# Patient Record
Sex: Male | Born: 1935
Health system: Southern US, Community
[De-identification: ages and names within clinical notes are randomized; demographics above are authoritative.]

## PROBLEM LIST (undated history)

## (undated) DIAGNOSIS — E079 Disorder of thyroid, unspecified: Secondary | ICD-10-CM

## (undated) DIAGNOSIS — E119 Type 2 diabetes mellitus without complications: Secondary | ICD-10-CM

## (undated) DIAGNOSIS — F0390 Unspecified dementia without behavioral disturbance: Secondary | ICD-10-CM

## (undated) DIAGNOSIS — I2510 Atherosclerotic heart disease of native coronary artery without angina pectoris: Secondary | ICD-10-CM

## (undated) DIAGNOSIS — E785 Hyperlipidemia, unspecified: Secondary | ICD-10-CM

## (undated) DIAGNOSIS — R4701 Aphasia: Secondary | ICD-10-CM

## (undated) DIAGNOSIS — I10 Essential (primary) hypertension: Secondary | ICD-10-CM

## (undated) DIAGNOSIS — C61 Malignant neoplasm of prostate: Secondary | ICD-10-CM

## (undated) DIAGNOSIS — D649 Anemia, unspecified: Secondary | ICD-10-CM

## (undated) HISTORY — DX: Anemia, unspecified: D64.9

## (undated) HISTORY — DX: Unspecified dementia, unspecified severity, without behavioral disturbance, psychotic disturbance, mood disturbance, and anxiety: F03.90

## (undated) HISTORY — DX: Atherosclerotic heart disease of native coronary artery without angina pectoris: I25.10

## (undated) HISTORY — DX: Essential (primary) hypertension: I10

## (undated) HISTORY — DX: Malignant neoplasm of prostate: C61

## (undated) HISTORY — DX: Hyperlipidemia, unspecified: E78.5

## (undated) HISTORY — DX: Type 2 diabetes mellitus without complications: E11.9

---

## 1958-10-14 HISTORY — PX: FINGER SURGERY: SHX640

## 1998-10-14 HISTORY — PX: CATARACT EXTRACTION: SUR2

## 2005-08-27 ENCOUNTER — Ambulatory Visit: Payer: Self-pay | Admitting: Internal Medicine

## 2005-08-27 ENCOUNTER — Inpatient Hospital Stay (HOSPITAL_COMMUNITY): Admission: EM | Admit: 2005-08-27 | Discharge: 2005-09-02 | Payer: Self-pay | Admitting: Emergency Medicine

## 2005-09-02 ENCOUNTER — Encounter: Payer: Self-pay | Admitting: Internal Medicine

## 2005-09-16 ENCOUNTER — Ambulatory Visit: Payer: Self-pay | Admitting: Cardiology

## 2008-01-11 ENCOUNTER — Ambulatory Visit: Payer: Self-pay | Admitting: Internal Medicine

## 2008-01-19 ENCOUNTER — Ambulatory Visit: Payer: Self-pay | Admitting: Internal Medicine

## 2009-08-22 ENCOUNTER — Emergency Department (HOSPITAL_COMMUNITY): Admission: EM | Admit: 2009-08-22 | Discharge: 2009-08-22 | Payer: Self-pay | Admitting: Emergency Medicine

## 2010-11-19 DIAGNOSIS — I10 Essential (primary) hypertension: Secondary | ICD-10-CM | POA: Insufficient documentation

## 2010-11-20 ENCOUNTER — Encounter: Payer: Self-pay | Admitting: Cardiovascular Disease

## 2010-11-20 ENCOUNTER — Ambulatory Visit (INDEPENDENT_AMBULATORY_CARE_PROVIDER_SITE_OTHER): Payer: Medicare Other | Admitting: Cardiovascular Disease

## 2010-11-20 DIAGNOSIS — I10 Essential (primary) hypertension: Secondary | ICD-10-CM

## 2010-11-20 DIAGNOSIS — I2510 Atherosclerotic heart disease of native coronary artery without angina pectoris: Secondary | ICD-10-CM
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
## 2010-12-17 ENCOUNTER — Encounter: Payer: Self-pay | Admitting: Internal Medicine

## 2010-12-17 ENCOUNTER — Ambulatory Visit (INDEPENDENT_AMBULATORY_CARE_PROVIDER_SITE_OTHER): Payer: Medicare Other | Admitting: Internal Medicine

## 2010-12-17 DIAGNOSIS — R197 Diarrhea, unspecified: Secondary | ICD-10-CM

## 2010-12-17 DIAGNOSIS — R159 Full incontinence of feces: Secondary | ICD-10-CM

## 2010-12-17 DIAGNOSIS — F0390 Unspecified dementia without behavioral disturbance: Secondary | ICD-10-CM | POA: Insufficient documentation
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
## 2011-01-01 NOTE — Miscellaneous (Signed)
Summary: [facility]   Imported By: Bubba Hales 12/26/2010 10:56:49  _____________________________________________________________________  External Attachment:    Type:   Image     Comment:   External Document

## 2011-01-16 LAB — URINALYSIS, ROUTINE W REFLEX MICROSCOPIC
Bilirubin Urine: NEGATIVE
Glucose, UA: NEGATIVE mg/dL
Ketones, ur: NEGATIVE mg/dL
Leukocytes, UA: NEGATIVE
Nitrite: NEGATIVE
Protein, ur: NEGATIVE mg/dL
Specific Gravity, Urine: 1.006 (ref 1.005–1.030)
Urobilinogen, UA: 0.2 mg/dL (ref 0.0–1.0)
pH: 7 (ref 5.0–8.0)

## 2011-01-16 LAB — COMPREHENSIVE METABOLIC PANEL
ALT: 22 U/L (ref 0–53)
AST: 23 U/L (ref 0–37)
Albumin: 3.3 g/dL — ABNORMAL LOW (ref 3.5–5.2)
Alkaline Phosphatase: 64 U/L (ref 39–117)
BUN: 18 mg/dL (ref 6–23)
CO2: 27 mEq/L (ref 19–32)
Calcium: 8.3 mg/dL — ABNORMAL LOW (ref 8.4–10.5)
Chloride: 99 mEq/L (ref 96–112)
Creatinine, Ser: 1.22 mg/dL (ref 0.4–1.5)
GFR calc Af Amer: >60 mL/min (ref >60)
GFR calc non Af Amer: 56 mL/min — ABNORMAL LOW (ref >60)
Glucose, Bld: 166 mg/dL — ABNORMAL HIGH (ref 70–99)
Potassium: 3.5 mEq/L (ref 3.5–5.1)
Sodium: 131 mEq/L — ABNORMAL LOW (ref 135–145)
Total Bilirubin: 0.4 mg/dL (ref 0.3–1.2)
Total Protein: 6.1 g/dL (ref 6.0–8.3)

## 2011-01-16 LAB — CBC
HCT: 29.5 % — ABNORMAL LOW (ref 39.0–52.0)
Hemoglobin: 10.1 g/dL — ABNORMAL LOW (ref 13.0–17.0)
MCHC: 34.3 g/dL (ref 30.0–36.0)
MCV: 93.9 fL (ref 78.0–100.0)
Platelets: 150 10*3/uL (ref 150–400)
RBC: 3.14 MIL/uL — ABNORMAL LOW (ref 4.22–5.81)
RDW: 14.7 % (ref 11.5–15.5)
WBC: 4.9 10*3/uL (ref 4.0–10.5)

## 2011-01-16 LAB — DIFFERENTIAL
Basophils Absolute: 0 10*3/uL (ref 0.0–0.1)
Basophils Relative: 1 % (ref 0–1)
Eosinophils Absolute: 0.2 10*3/uL (ref 0.0–0.7)
Eosinophils Relative: 4 % (ref 0–5)
Lymphocytes Relative: 25 % (ref 12–46)
Lymphs Abs: 1.2 10*3/uL (ref 0.7–4.0)
Monocytes Absolute: 0.7 10*3/uL (ref 0.1–1.0)
Monocytes Relative: 13 % — ABNORMAL HIGH (ref 3–12)
Neutro Abs: 2.8 10*3/uL (ref 1.7–7.7)
Neutrophils Relative %: 57 % (ref 43–77)

## 2011-01-16 LAB — URINE MICROSCOPIC-ADD ON: Urine-Other: NONE SEEN

## 2011-02-04 ENCOUNTER — Encounter: Payer: Self-pay | Admitting: Internal Medicine

## 2011-02-04 ENCOUNTER — Ambulatory Visit (INDEPENDENT_AMBULATORY_CARE_PROVIDER_SITE_OTHER): Payer: Medicare Other | Admitting: Internal Medicine

## 2011-02-04 DIAGNOSIS — R197 Diarrhea, unspecified: Secondary | ICD-10-CM

## 2011-02-04 DIAGNOSIS — R159 Full incontinence of feces: Secondary | ICD-10-CM

## 2011-02-04 NOTE — Assessment & Plan Note (Signed)
Improved but still a problem. Also having urinary incontinence thinks it is a function of his dementia and other issues. Daughter agrees this is about as good as it can get at this point. His overall situation is improved by stopping sugar-free candy.

## 2011-02-04 NOTE — Assessment & Plan Note (Signed)
Improved by stopping sugar-free candy. Daughter is satisfied with current status. Stool studies were not done. She believes she is improved as much as possible at this point so we'll hold here. Follow up as needed.

## 2011-02-04 NOTE — Progress Notes (Signed)
77 year old white man seen previously for diarrhea and fecal incontinence. He has been moved into a more supervised setting at the nursing home given his dementia. There's been good reduction if not elimination and use of sugar-free candy in his diarrhea and fecal incontinence are improved though he still has incontinence of stool and urine. His daughter is overall satisfied with things. He is wearing depends. His medications been modified to minimize diarrhea as well. His stool studies for the last day since he therefore not completed but the daughter feels like they're not necessary at this time so we will not do that.  Medications past medical history allergies all reviewed and reflected in the database.

## 2011-02-25 ENCOUNTER — Encounter: Payer: Medicare Other | Admitting: Internal Medicine

## 2011-02-26 NOTE — Assessment & Plan Note (Signed)
[place] OFFICE NOTE   NAME:GREENLauris, Umbach                           MRN:          [id number]  DATE:01/11/2008                            DOB:          08-05-1920    REFERRING PHYSICIAN:  Tammy R. Modena Morrow, M.D.   CHIEF COMPLAINT:  Anemia.   ASSESSMENT:  An 77 year old white man with a chronic anemia which is  somewhat worse and has not responded to iron.  This may very well be an  anemia of chronic disease.  On rectal exam today he had a Hemoccult  positive stool.  He has had some stools that seem to be a little bit  looser over time, so there may be a change in bowel habits but the  situation is complicated by dementia and he does not live here.  This  has been observed by his daughter when she has him up here in [place]  from his home in [place].  He has had about a 10 pound weight loss over  the last couple of years though his appetite is good.  That certainly  could be from diabetes that is not well controlled, as his blood sugars  do go over 200 at times.  He does not have any early satiety or appetite  change.   RECOMMENDATIONS AND PLAN:  A colonoscopy will be undertaken because of  the heme positive stool and the anemia.  Further plans pending that.  If  that is unrevealing, I probably would not pursue any further evaluation  at this time as there is an overall relatively set of stable symptoms  and signs it seems.   HISTORY:  An 77 year old white man with problems as above.  He came to  Dr. Greta Doom' attention after he had a syncopal episode and hyperglycemia  and perhaps DKA in 2006.  At that time he had hemoglobin in the 11-13  range, somewhat variable on hospital records, with a normal MCV.  Now he  has a hemoglobin in the low 11 to 10.9 range (today) with an MCV of 90.  Platelets slightly low at 133, white count 4.9.  He cannot provide much  reliable history due to the dementia and significant  memory disturbance.  His daughter believes his stools have become somewhat loose over time  without incontinence in the past several months or so.  He denies any  other problems other than those things mentioned above.  The anemia has  not really responded to iron supplementation as noted.  I do not know if  he has had B12 or folate checked recently.  They were normal in November  of 2006.  So was his TSH.   MEDICATIONS:  1. Actos 15 mg daily.  2. Lisinopril 40 mg daily.  3. Coreg 12.5 mg b.i.d.  4. Glucotrol XL 5 mg b.i.d.  5. Aspirin 81 mg daily.  6. HCTZ 25 mg daily.  7. Exelon 3 mg b.i.d.  8. Namenda 10 mg b.i.d.  9. Ferrous sulfate 325 mg daily.  10.Vytorin 10/10 daily.   There are no known  drug allergies.   PAST MEDICAL HISTORY:  1. Dementia, Alzheimer's type.  2. Hypertension.  3. Diabetes mellitus type 2.  4. Dyslipidemia.  5. Prostate cancer, 2002.  6. Previous history of angioplasty.  7. Cataract surgery in 2000.  8. History of right basal ganglia infarction and occipital lobe      infarcts as well.   FAMILY HISTORY:  Diabetes in a brother.   SOCIAL HISTORY:  1. He is widowed.  2. He lives alone.  3. He does have somebody come in and check on him.  4. He still drives a short way.  5. He is retired from a Midwife.  6. No alcohol, tobacco or drugs.  7. His daughter looks in on him as best she can.  He comes up here      with her at times.  He is currently here because the person that      had been checking on him was unable to do so over the next 2 weeks.   REVIEW OF SYSTEMS:  Positive for confusion and memory disturbance,  urinary difficulty at times.  All other systems are negative, as best we  can tell.   PHYSICAL EXAMINATION:  Height 5 feet 6 inches, weight 144 pounds, blood  pressure 128/60, pulse 60 and regular.  This is a well-developed,  elderly white man looking a little younger than stated age.  EYES:  Anicteric.  MOUTH:  Free of  lesions.  NECK:  Supple, no thyromegaly or mass.  CHEST:  Clear.  HEART:  S1 S2.  No rubs, murmurs or gallops are heard.  ABDOMEN:  Soft and nontender, without organomegaly or mass.  RECTAL:  Shows brown Hemoccult positive stool.  Formed stool in the  vault, no mass.  EXTREMITIES:  No edema.  NEURO:  He is awake.  He appears alert but he is not oriented at all.  Cranial nerves II through XII grossly intact.  PSYCH:  Appropriate mood and affect.   I appreciate the opportunity to care for this patient.   Note, I have looked at all hospital records as well as recent labs and  office notes from Dr. Modena Morrow.     Gatha Mayer, MD,FACG  Electronically Signed    CEG/MedQ  DD: 01/11/2008  DT: 01/11/2008  Job #: [id number]   cc:   Tammy R. Modena Morrow, M.D.

## 2011-02-27 ENCOUNTER — Other Ambulatory Visit: Payer: Self-pay | Admitting: Internal Medicine

## 2011-02-27 ENCOUNTER — Encounter (HOSPITAL_BASED_OUTPATIENT_CLINIC_OR_DEPARTMENT_OTHER): Payer: Medicare Other | Admitting: Internal Medicine

## 2011-02-27 DIAGNOSIS — D539 Nutritional anemia, unspecified: Secondary | ICD-10-CM

## 2011-02-27 DIAGNOSIS — D649 Anemia, unspecified: Secondary | ICD-10-CM

## 2011-02-27 LAB — CBC & DIFF AND RETIC
BASO%: 0.5 % (ref 0.0–2.0)
Basophils Absolute: 0 10*3/uL (ref 0.0–0.1)
EOS%: 3 % (ref 0.0–7.0)
Eosinophils Absolute: 0.2 10*3/uL (ref 0.0–0.5)
HCT: 29.8 % — ABNORMAL LOW (ref 38.4–49.9)
HGB: 9.9 g/dL — ABNORMAL LOW (ref 13.0–17.1)
Immature Retic Fract: 5.8 % (ref 0.00–13.40)
LYMPH%: 19.1 % (ref 14.0–49.0)
MCH: 30.6 pg (ref 27.2–33.4)
MCHC: 33.2 g/dL (ref 32.0–36.0)
MCV: 92 fL (ref 79.3–98.0)
MONO#: 0.7 10*3/uL (ref 0.1–0.9)
MONO%: 11.1 % (ref 0.0–14.0)
NEUT#: 3.9 10*3/uL (ref 1.5–6.5)
NEUT%: 66.3 % (ref 39.0–75.0)
Platelets: 168 10*3/uL (ref 140–400)
RBC: 3.24 10*6/uL — ABNORMAL LOW (ref 4.20–5.82)
RDW: 15.2 % — ABNORMAL HIGH (ref 11.0–14.6)
Retic %: 0.66 % (ref 0.50–1.60)
Retic Ct Abs: 21.38 10*3/uL — ABNORMAL LOW (ref 24.10–77.50)
WBC: 5.9 10*3/uL (ref 4.0–10.3)
lymph#: 1.1 10*3/uL (ref 0.9–3.3)
nRBC: 0 % (ref 0–0)

## 2011-03-01 LAB — COMPREHENSIVE METABOLIC PANEL
AST: 19 U/L (ref 0–37)
Albumin: 3.6 g/dL (ref 3.5–5.2)
Alkaline Phosphatase: 59 U/L (ref 39–117)
BUN: 37 mg/dL — ABNORMAL HIGH (ref 6–23)
Calcium: 8.2 mg/dL — ABNORMAL LOW (ref 8.4–10.5)
Chloride: 101 mEq/L (ref 96–112)
Creatinine, Ser: 1.27 mg/dL (ref 0.40–1.50)
Glucose, Bld: 179 mg/dL — ABNORMAL HIGH (ref 70–99)
Potassium: 4 mEq/L (ref 3.5–5.3)

## 2011-03-01 LAB — IRON AND TIBC
%SAT: 23 % (ref 20–55)
Iron: 63 ug/dL (ref 42–165)
TIBC: 274 ug/dL (ref 215–435)
UIBC: 211 ug/dL

## 2011-03-01 LAB — PROTEIN ELECTROPHORESIS, SERUM
Albumin ELP: 59.5 % (ref 55.8–66.1)
Alpha-1-Globulin: 4.6 % (ref 2.9–4.9)
Alpha-2-Globulin: 11.2 % (ref 7.1–11.8)
Beta 2: 4 % (ref 3.2–6.5)
Gamma Globulin: 14.8 % (ref 11.1–18.8)
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
## 2011-03-01 NOTE — Cardiovascular Report (Signed)
[name], [name]                  ACCOUNT NO.:  [id number]   MEDICAL RECORD NO.:  [id number]          PATIENT TYPE:  INP   LOCATION:  6527                         FACILITY:  [facility]   PHYSICIAN:  [name], M.D. LHCDATE OF BIRTH:  12-05-1919   DATE OF PROCEDURE:  08/28/2005  DATE OF DISCHARGE:                              CARDIAC CATHETERIZATION   PROCEDURE:  Cutting balloon angioplasty of the ostium of the first diagonal  branch.   INDICATIONS:  [name] is an 77 year old gentleman without prior history of  cardiovascular disease. On the afternoon of  August 24, 2005, he had an  episode of syncope. Troponin was mildly elevated. The patient has mild  dementia but did not recall any chest discomfort nor did he complain of such  to family. Per family's recommendations, he was initially hospitalized at  [facility] in [facility]. Echocardiogram reportedly demonstrated  anterior hypokinesis with an ejection fraction of 45%. He has not had  recurrent symptoms. The patient brought him to [place], which his care  was proceeded with here. He was admitted to hospital yesterday and underwent  diagnostic angiography by Dr. Wilhemina [name] today. This demonstrated a 95% stenosis  of the ostium of the large first diagonal branch. Left ventricular EF was  65% without regional wall motion abnormality. I was asked for percutaneous  intervention on this lesion.   It was certainly not clear that the lesion was the cause of his syncope.  However, it had a hazy appearance to it. The elevated troponin did suggest a  possible acute coronary syndrome and the anterior hypokinesis demonstrated  on the echocardiogram two days ago at [facility] may have suggested some  stunting which has subsequently recovered. The territory fit with that of  the diagonal. I was thus concerned that this was the culprit lesion for an  acute coronary syndrome and decided to proceed with percutaneous  revascularizations to minimize the chance of recurrence.   PROCEDURAL TECHNIQUE:  Informed consent was obtained from the patient's  family. The patient also consented. There was a preexisting 6-French sheath  in the right common femoral artery. Anticoagulation was initiated with  bivalirudin and 600 milligrams of Plavix. ACT was maintained at greater than  225 seconds. A 6-French CLS 3.5 guide was advanced over wire and engaged in  the ostium of the left main. A Prowater wire was advanced in to the distal  portion of the first diagonal without difficulty. The lesion was initially  dilated using a 2.0 x 9 mm maverick for two inflations at 6 atmospheres.  This demonstrated no plaque shift into the LAD. I thus proceeded with  cutting balloon angioplasty using a 2.25 x 10 mm cutter for two inflations,  each to 10 atmospheres. With the second inflation, it was clear that there  was some leakage of contrast from the balloon. Surprisingly, pressure was  maintained within the groin. With the leakage, however, it was clear that  there was a small perforation of the balloon. Balloon was therefore quickly  deflated and removed. Repeat angiography demonstrated approximately 10%  residual stenosis with node dissection, no perforation, and TIMI III flow to  the distal vasculature. There was no plaque shift into the LAD.   The arteriotomy was then closed using a Starclose device. Complete  hemostasis was obtained. The patient was then transferred to holding room in  stable condition having tolerated the procedure well.   COMPLICATIONS:  None.   IMPRESSION/RECOMMENDATIONS:  Successful cutting balloon angioplasty of the  first diagonal branch of the left anterior descending artery. Since I am  concerned that the precipitating event was an acute coronary syndrome, I  will give Plavix for 30 days in addition to his aspirin. With his dementia,  I do not think we should continue it beyond that. After that,  we will  continue with Plavix alone.      [name], M.D. [facility]  Electronically Signed     WED/MEDQ  D:  08/28/2005  T:  08/28/2005  Job:  [id number]   cc:   [name]  Fax: [phone]   [name], M.D.  [facility]

## 2011-03-01 NOTE — Discharge Summary (Signed)
NAME:  [name], Cory                  ACCOUNT NO.:  [id number]   MEDICAL RECORD NO.:  [id number]          PATIENT TYPE:  INP   LOCATION:  62                         FACILITY:  [facility]   PHYSICIAN:  Mobolaji B. Bakare, M.D.DATE OF BIRTH:  03/17/1920   DATE OF ADMISSION:  08/27/2005  DATE OF DISCHARGE:  09/02/2005                                 DISCHARGE SUMMARY   ADDENDUM:  This is an addendum to an earlier dictation done by Dr.  Sherryl Manges dictated August 30, 2005.   PRIMARY CARE PHYSICIAN:  Tammy R. Modena Morrow, M.D.   CARDIOLOGIST:  Glori Bickers, M.D.   NEUROLOGIST:  Dr. Henrene Dodge. Doy Mince.   Mr. Cozad was discharged on September 02, 2005.   DISCHARGE MEDICATIONS:  1.  Aspirin 81 mg once a day.  2.  Plavix 75 mg daily.  3.  Coreg 12.5 mg b.i.d.  4.  Glucotrol XL 5 mg b.i.d.  5.  Hydrochlorothiazide 12.5 mg once a day.  6.  Lisinopril 40 mg once a day.  7.  Actos 15 mg once a day.  8.  Zocor 40 mg once a day.   FOLLOW UP:  1.  With Dr. Haroldine Laws in two weeks.  2.  Dr. Laurann Montana in two weeks.  3.  Dr. Florina Ou in one to two weeks.   ADDITIONAL OPERATION/PROCEDURE:  Transesophageal echocardiogram:  The left  ventricular systolic function was normal.  Left ventricular ejection  fraction was estimated  between 55 to 65%.  There was no left ventricular  regional wall motion abnormalities.  There was mild atheroma of the  ascending aorta, trivial aortic valvular regurgitation, mild mitral valvular  regurgitation. There was no left atrial appendage thrombus identified.  There was a left atrial septal aneurysm.  There was no evidence of shunting  across the entire atrial septum by color-flow Doppler.   HOSPITAL COURSE:  Problem #1.  The patient was stable while waiting for the  transesophageal echocardiogram.  There was no identifiable signs of tumors  and the patient did not have to be on anticoagulation.  The transesophageal  echocardiogram did show left atrial  aneurysm but there was no evidence of  shunting across the entire atrial septum.  He was continued on aspirin and  Plavix.   Problem #2.  Blood pressure control was further optimized by increasing the  patient's Coreg and at the time of discharge, he was discharged home on 12.5  mg p.o. b.i.d.  Blood pressure was 105/67.   Problem #3. Diabetes control was further optimized with addition of Actos to  the Glucotrol XL.  This can be further assessed and optimized by Dr. Modena Morrow  as an outpatient.  The patient and family have been instructed to monitor  CBG.   Problem #4.  Probable underlying dementia.  Mr. Witmer was observed in the  hospital and he was noted to be confused and wandering around a lot during  the hospital stay.  Daughter has also noted symptoms to be suggestive of  progressive dementia.  The patient was not started on any medication during  this hospitalization.  Would defer further evaluation and possible treatment  to Dr. Florina Ou.      Mobolaji B. Maia Petties, M.D.  Electronically Signed     MBB/MEDQ  D:  09/11/2005  T:  09/11/2005  Job:  [id number]   cc:   Tammy R. Modena Morrow, M.D.  Fax: [phone]   Pramod P. Leonie Man, MD  Fax: [phone]   Glori Bickers, M.D. [place]  [place] [address]

## 2011-03-01 NOTE — Discharge Summary (Signed)
[name], [name]                  ACCOUNT NO.:  [id number]   MEDICAL RECORD NO.:  [id number]          PATIENT TYPE:  INP   LOCATION:  6527                         FACILITY:  [facility]   PHYSICIAN:  Sherryl Manges, M.D.  DATE OF BIRTH:  July 07, 1920   DATE OF ADMISSION:  08/27/2005  DATE OF DISCHARGE:                                 DISCHARGE SUMMARY   INTERIM DISCHARGE SUMMARY:   DATE OF DISCHARGE:  To be determined.   PRIMARY MEDICAL DOCTORS:  1.  Tammy R. Modena Morrow, M.D., [facility], [place], [place].  2.  Dr. Gwenyth Bouillon, [place], [place], [facility].   DIAGNOSES:  1.  Syncopal episode, query etiology. Likely secondary to embolic      cerebrovascular accident.  2.  Multiple embolic cerebrovascular accidents, effecting right basal      ganglia and bilateral occipital lobes.  3.  Coronary artery disease with single vessel disease, status post PTCA,      August 29, 2005.  4.  Type 2 diabetes mellitus.  5.  Dyslipidemia.  6.  Hypertension.   DISCHARGE MEDICATIONS:  1.  Coreg 6.25 mg p.o. b.i.d.  2.  Aspirin 81 mg p.o. every day.  3.  Plavix 75 mg p.o. every day for 30 days only.  4.  Glipizide 5 mg p.o. b.i.d.  5.  Insulin sliding scale with NovoLog according to the following regimen:      CBG 60-100, no insulin.  CBG 101-150, 2units.  CBG 151-200, 3 units.      CBG 201-250, 6 units.  CBG 251-300, 9 units.  CBG 301-350, 12 units.      CBG over 350, 15 units.  6.  Lisinopril 40 mg p.o. daily.  7.  Zocor 40 mg p.o. q.h.s.  This medication list may, of course, be updated in addendum by discharging  M.D. at the time of actual discharge.   PROCEDURES:  1.  Chest x-ray, dated August 27, 2005.  This showed no definitive active      disease within the lungs.  There are two vague densities overlying the      left mid lung field, of doubtful clinical significance.  2.  Brain MRI/MRA, dated August 29, 2005.  This showed right basal ganglia  infarction involving the lentiform and caudate nuclei with suggestion of      central areas of hemorrhage.  Additional acute/subacute infarction of      the occipital lobes bilaterally, right greater than left with some      extension to the ventricular surface on the right.  Also, additional      areas of punctate and confluent T2 hyper-intensities in the subcortical      periventricular areas bilaterally, most likely sequelae of chronic      microvascular ischemia.  MRA shows extensive irregular narrowing      throughout the anterior and posterior separation compatible with      intracranial atherosclerosis.  3.  Chest CT angiogram, dated August 29, 2005.  This showed no evidence of      pulmonary embolism.  There was  borderline left ventricular wall      thickening.  4.  Head CT scan, dated August 30, 2005.  This showed infarcts as noted on      recent MRI and appear non-hemorrhagic.  These include right basal      ganglia infarct and bilateral occipital infarcts.  5.  Carotid/vertebral artery Duplex scan, dated August 28, 2005.  This      showed on the right mild intimal wall changes CCA.  Minimal      heterogeneous plaque at the bifurcation/origin of ICA and ECA.  Left      minimal intimal wall changes, proximal to mid CCA, increasing to mild at      the bifurcation, mild focal heterogeneous plaque posterior wall proximal      CCA.  No evidence of significant ICA stenosis.  Vertebral artery flow is      antegrade.  6.  Cardiac catheterization, dated August 28, 2005, by Dr. Glori Bickers.  This showed left main coronary artery which is large with      luminal irregularities, left anterior descending coronary artery with a      95% stenosis in proximal portion of the first diagonal, second diagonal      is moderate caliber with 65% to 70% stenosis.  Assessment:  Single      vessel coronary artery disease in diagonal. Normal left ventricular      systolic function.  7.   Cutting balloon angioplasty of the ostium of the first diagonal branch      of the LAD by Dr. Sabino Snipes, August 28, 2005.   CONSULTATIONS:  1.  Glori Bickers, M.D. [facility], cardiologist.  2.  Ethelle Lyon, M.D. [facility], cardiologist.  [place] Doy Mince, M.D., neurologist.  4.  Pramod P. Leonie Man, M.D., neurologist.   ADMISSION HISTORY:  As in H&P notes of August 27, 2005.  However, in  brief, this is an 77 year old male, with known history of hypertension and  recently diagnosed diabetes mellitus, presenting to the emergency department  at [facility],  via referral by PMD, Dr. Florina Ou, for further  investigation, evaluation, and management.  Apparently, the patient had the  syncopal episode on the afternoon of August 15, 2005, following lunch at [employer].  He was subsequently admitted at the [facility] in  [facility] where he was found to be markedly hyperglycemic with a blood  glucose of 452 mg/dL, with no antecedent history of diabetes mellitus.  He  was also found to have a mildly elevated troponin I.  Head CT scan was  negative.  Following discharge from hospital on August 25, 2005, he was  subsequently evaluated by cardiologist, Dr. Dorian Pod, who found him to have an  abnormal electrocardiogram.  On visiting his PMD, Dr. Modena Morrow, in the a.m. of  August 27, 2005, he was referred to [facility] as stated above.   CLINICAL COURSE:  1.  SYNCOPAL EPISODE.  This is deemed likely secondary to cerebrovascular      disease, due to multiple embolic infarcts.  Following admission, the      patient was examined, and no focal neurologic deficit was found on      initial evaluation.  Review of medical records, which the patient      brought with him, indicated that head CT scan, dated August 24, 2005      at [facility], was negative for acute intracranial  pathology.     It was felt appropriate to carry out followup brain MRI/MRA, to  arrange      a carotid Duplex scan, and check lipid profile.  Details of brain MRI      are as stated in procedure list above.  However, in brief it showed a      right basal ganglia CVA with probable hemorrhage as well as bilateral      ischemic, possibly embolic, occipital lobe infarcts .  Followup head CT      scan, on August 30, 2005, showed no evidence of hemorrhage.  The      patient is, therefore, currently on antiplatelet medications.  Review of      the patient's lipid profile showed the following findings:  Total      cholesterol 209, triglycerides 93, HDL 50, LDL 140.  He has, therefore,      been started on high dose Statin treatment accordingly.  Thyroid profile      was normal with a TSH of 2.222.  RPR was nonreactive.  Homocystine level      was normal at 14.2.  Neurologic consultation was requested and kindly      provided by Drs. Reynolds and [employer].  Conclusion is that the patient's      syncopal episode is likely secondary to embolic phenomena, resulting in      multiple embolic CVAs.  Apart from risk factor modification, it is felt      that the patient will benefit from a transesophageal echocardiogram,      looking specifically for a cardioembolic source, as this would determine      whether the patient should be anticoagulated or not.  It is also felt      the patient may require a period of inpatient rehabilitation.   1.  CORONARY ARTERY DISEASE.  As mentioned in admitting history, the patient      was found to have a mildly elevated troponin I at the [facility].  He underwent a 2D echocardiogram by cardiologist, Dr. Dorian Pod,      who found the patient to have a left ventricular ejection fraction of      45%, normal valves, mild septal hypertrophy, dilated right atrium and      right ventricle, and also hypokinesis of left ventricular anterior wall.      With these abnormalities in mind and against the background of risk      factors such as age,  gender, recently diagnosed diabetes mellitus,      hypertension, dyslipidemia, there was a high index of suspicion for      coronary artery disease.  Cardiology consultation was called, which was      kindly provided by Dr. Glori Bickers, who recommended cardiac      catheterization.  This was carried out on August 28, 2005 and      essentially showed single vessel coronary artery disease involving the      first diagonal branch of the LAD with 95% narrowing.  This was      subsequently addressed with PCI via cutting PTCA on August 28, 2005,      carried out by Dr. Albertine Patricia.  The patient tolerated the procedure well.      It was recommended he remain on Plavix for 30 days following this      procedure.  He will require cardiology followup upon discharge.  He has  currently been placed on Coreg.   1.  NEWLY DIAGNOSED TYPE 2 DIABETES MELLITUS.  As mentioned in the above     admission history, the patient was found to have hyperglycemia of 452      mg/dL on August 20, 2005, at presentation at [facility].  He      was managed at [facility] with a combination of carbohydrate      modified diet, glipizide, and sliding scale insulin, with satisfactory      control and euglycemia.  His daughter and son in law, have been given      diabetic teaching and taught how to monitor the patient's blood glucose      levels and administer insulin via sliding scale.  It is expected that on      discharge, the patient will follow up with his PMD, Dr. Charleston Poot,      who will monitor his glycemia, adjust medications as indicated.   1.  HYPERTENSION.  The patient is a known hypertensive.  His blood pressure      has been adequately controlled with a combination of Coreg and ACE      inhibitor treatment.  Of course, given his recent CVAs, the plan is not      to be overly aggressive with blood pressure control at the present time,      provided systolic BP remains less than  180-mmHg.   1.  DYSLIPIDEMIA.  The patient is currently on high dose Statin for this.   DISPOSITION:  This is to be elucidated in addendum, at the time of actual  discharge by the discharging M.D.  It is pertinent to note that because of  incidental finding of right ventricular/right atrial dilatation on initial  echocardiogram, August 26, 2005, by Dr. Dorian Pod, it was felt essential to  rule out pulmonary embolism.  The patient underwent a chest CT angiogram on  August 29, 2005, which was negative for pulmonary embolism.  As  mentioned above, the plan is to carry out TEE to rule out possible  cardioembolic source of embolization.  Should this be present, the patient  will likely require anticoagulation.  Per neurology recommendations, the  patient is likely to require period of inpatient rehabilitation.      Sherryl Manges, M.D.  Electronically Signed     CO/MEDQ  D:  08/30/2005  T:  08/30/2005  Job:  [id number]   cc:   Tammy R. Modena Morrow, M.D.  Fax: [phone]   Gwenyth Bouillon, Dr.  [place], [place]  Fax #: [phone]   Glori Bickers, M.D. [place] [address]  [place]  [place] [address]   Ethelle Lyon, M.D. [facility]  [address]  [place] [address]   Michael L. Doy Mince, M.D.  Fax: [phone]   Pramod P. Leonie Man, MD  Fax: [phone]

## 2011-03-01 NOTE — Consult Note (Signed)
NAMEDUJUAN, AGRELLA                  ACCOUNT NO.:  [id number]   MEDICAL RECORD NO.:  [id number]          PATIENT TYPE:  INP   LOCATION:  [id number]                         FACILITY:  [facility]   PHYSICIAN:  Glori Bickers, M.D. LHCDATE OF BIRTH:  06-07-1920   DATE OF CONSULTATION:  08/27/2005  DATE OF DISCHARGE:                                   CONSULTATION   PRIMARY CARE PHYSICIAN:  Dr. Gwenyth Bouillon, [place], [facility]; Dr.  Jae Dire. Modena Morrow [place], [place].   REQUESTING PHYSICIAN:  Sherryl Manges, M.D.   REASON FOR CONSULTATION:  Syncopal episode and abnormal echocardiogram.   Mr. Pink is an 77 year old male with a history of hypertension, dementia,  and recently diagnosed diabetes, who we are asked to consult on regarding a  recent syncopal episode and abnormal echocardiogram. He denies any history  of known coronary artery disease. He has never had a previous stress test or  cardiac catheterization. As he does have significant problem with his short-  term memory, most of the history is garnered from a combination of the  chart, Mr. Conner, and his daughter. Reportedly, he had abrupt syncopal  episode while eating lunch on August 24, 2005. According to witnesses he  was sitting at a table when he became stiff and just fell over backwards  hitting his head on the carpet. There was no antecedent chest pain,  palpitations. There was no seizure activity or bladder or bowel  incontinence. He was taken to the [facility]. It was reportedly  mildly elevated 0.50. He was also found to be markedly hyperglycemic with a  blood sugar of greater than 450. He was triaged and discharged home the next  day on November 12th. [place] An  echocardiogram was obtained which showed an EF of 45% with anterior  hypokinesis. There is also a dilated right atrium and right ventricle with  mild tricuspid regurgitation. Cardiac catheterization was  suggested, but the  family elected to bring him to [facility] for further evaluation. This  morning he went to Dr. Florina Ou in primary care who admitted for further  workup. He is currently asymptomatic.   REVIEW OF SYSTEMS:  He denies any recent fevers or chills. He has not had a  cough. He has not had any bright red blood per rectum or melena. He denies  any change in his functional capacity. At baseline, he lives alone and does  all of his activities of daily living, though he does not exercise on a  regular basis. His daughter does admit that he has significant problems with  short-term memory loss. The rest of the review of systems is negative except  for HPI and problem list.   PAST MEDICAL HISTORY:  1.  Hypertension, poorly controlled.  2.  Diabetes, new diagnosis.  3.  Syncopal episode as described in HPI.      1.  Echocardiogram showed an EF of 45% with anterior hypokinesis. There          is also dilated RA and RV.  4.  Dementia with short-term memory loss.   CURRENT MEDICATIONS:  1.  Lisinopril 20 mg daily.  2.  Januvia 25 mg daily.  3.  Aspirin 325 mg daily.   ALLERGIES:  No known drug allergies.   SOCIAL HISTORY:  He lives alone in [place], [place]. He is retired. He  is also a widow. He denies any history of tobacco use. He does not drink any  alcohol. He has two daughters, one who lives in [place] and the other  lives in [place].   FAMILY HISTORY:  He has had two brothers  that died of heart disease.  Another brother had diabetes. His parents are deceased and his mother had  hypertension.   PHYSICAL EXAMINATION:  GENERAL: He is lying flat in bed, in no acute  distress.  VITAL SIGNS: He is afebrile with blood pressure of 175/70, heart rate 72,  saturating 97% on room air.  HEENT: Sclerae anicteric. EOMI. There is no xanthelasma. Mucous membranes  are moist.  NECK: Supple. There is no JVD. Carotids are 2+ bilaterally without any  bruits. There is  no lymphadenopathy or thyromegaly.  CARDIAC: Regular rate and rhythm with no murmurs, rubs, or gallops.  LUNGS: Clear to auscultation.  ABDOMEN: Soft, nontender, nondistended. There is no hepatosplenomegaly.  There are good bowel sounds. There are no bruits or masses.  EXTREMITIES: Warm with no clubbing, cyanosis, or edema. Femoral pulses are  2+ bilaterally without any bruits. Distal pulses are 1+ bilaterally.  NEUROLOGIC: He is awake, alert, and oriented times three with much  difficulty. He is unable to tell me who the president is. He has asked the  same questions over multiple times. Cranial nerves II-XII are intact. He  moves al four extremities without difficulty.   LABORATORY DATA:  White count 5.3, hematocrit 37.2%, platelet count 168,000.  Sodium 138, potassium 4.0, BUN 18, creatinine 1.1, glucose 270, troponin  0.09, CK 887, MB 1.9, albumin 3.3.   EKG from November 12th shows a normal sinus rhythm at 63, with inferolateral  ST-T wave changes consistent with repolarization versus ischemia. There is  no old tracing. He does have minimal ST elevation in aVL.   ASSESSMENT:  1.  Syncopal episode.  2.  Abnormal echocardiogram as described above.  3.  Diabetes, poorly controlled, new diagnosis.  4.  Hypertension, poorly controlled.  5.  Dementia.   DISCUSSION/PLAN:  Given the results of his echocardiogram, ECG, and  increased troponin, Mr. Ansari almost certainly  has a significant underlying  coronary artery disease. I am concerned that his syncopal episode may be  related to a ventricular arrhythmia in the setting of an acute coronary  syndrome. Fortunately, he is now asymptomatic. I have discussed the risks  and benefits of cardiac catheterization with both Mr. Mincey and his  daughter, and they have agreed to proceed. I also told them that given his  dementia if he had three-vessel coronary artery disease he would not be a CABG candidate. Of note, given the RA and RV  dilatation on the  echocardiogram this also raises the possibility of pulmonary embolus, but  without significant tachypnea or tachycardia I think this is a lot less  likely.   RECOMMENDATIONS:  1.  Cardiac catheterization in the morning.  2.  Begin Coreg 6.25 mg b.i.d. for blood pressure and heart rate control.  3.  Continue to watch on telemetry.  4.  Start Statin and continue his aspirin.  5.  Hold the ACE inhibitor  overnight and resume post catheterization.   Will continue to follow with you in the hospital and we appreciate the  consult.      Glori Bickers, M.D. [facility]  Electronically Signed     DB/MEDQ  D:  08/27/2005  T:  08/28/2005  Job:  [id number]   cc:   Tammy R. Modena Morrow, M.D.  Fax: [phone]   Gwenyth Bouillon, M.D.  [place], [place]

## 2011-03-01 NOTE — Cardiovascular Report (Signed)
NAMERAYYAN, MELLGREN                  ACCOUNT NO.:  [id number]   MEDICAL RECORD NO.:  [id number]          PATIENT TYPE:  INP   LOCATION:  3741                         FACILITY:  [facility]   PHYSICIAN:  Scarlett Presto, M.D.   DATE OF BIRTH:  Dec 19, 1919   DATE OF PROCEDURE:  08/28/2005  DATE OF DISCHARGE:                              CARDIAC CATHETERIZATION   PRIMARY CARE PHYSICIAN:  [facility].   CARDIOLOGIST:  Glori Bickers, M.D.   PROCEDURE:  1.  Left heart catheterization.  2.  Selective coronary angiography.  3.  Left ventriculography.   OPERATOR:  Scarlett Presto, M.D.   INDICATIONS FOR PROCEDURE:  Mr. Sarafin is an 77 year old man with significant  dementia, who had an episode of syncope while eating lunch.  He was  evaluated and found to have a mildly abnormal echocardiogram with some  evidence of LV dysfunction and wall motion abnormality.  He was referred for  a heart catheterization.   DESCRIPTION OF PROCEDURE:  After obtaining an informed consent from his  daughter, who is his power of attorney, the patient came to the cardiac  catheterization laboratory in the fasting state.  There he was prepped and  draped in the usual sterile manner and local anesthetic was obtained in the  right groin using 1% lidocaine without epinephrine.  The right femoral  artery was cannulated using the modified Seldinger technique with a 6-French  1 cm sheath and a left heart catheterization was performed using a 6-French  Judkins left #4, a 6-French Judkins right #4 and a 6-French pigtail  catheter.  A pigtail catheter was used for left ventriculography in the 30-  degree RAO view.  At the conclusion of the procedure the catheters were  removed.  The femoral artery sheath was sewn in place for a percutaneous  revascularization.   PRESSURES:  Aortic pressure:  136/57, mean of 86 mmHg.  Left ventricular pressure:  137/3, with an end diastolic pressure of 9 mmHg.   CORONARY ANGIOGRAPHY:  1.  LEFT MAIN CORONARY ARTERY:  The left main coronary artery is a large      vessel with luminal irregularities.  2.  LEFT ANTERIOR DESCENDING CORONARY ARTERY:  The left anterior descending      coronary artery is a large trans-apical vessel which has a large first      diagonal with a 95% stenosis in its proximal portion.  It is a large      artery that bifurcates.  The second diagonal is a moderate-caliber      vessel which has a 65%-70% stenosis.  3.  CIRCUMFLEX CORONARY ARTERY:  The circumflex coronary artery is a small      vessel with luminal irregularities.  4.  RIGHT CORONARY ARTERY:  The right coronary artery is a large dominant      vessel with multiple branching vessels with luminal irregularities.   LEFT VENTRICULOGRAM:  The left ventriculogram reveals a normal left  ventricular systolic function.  The estimated ejection fraction is 65%.  No  wall motion abnormalities.  No mitral regurgitation.   ASSESSMENT:  1.  Single vessel coronary artery disease in the diagonal.  2.  Normal left ventricular systolic function.  3.  Syncope.   PLAN:  To move forward with a percutaneous revascularization.      Scarlett Presto, M.D.  Electronically Signed     JH/MEDQ  D:  08/28/2005  T:  08/29/2005  Job:  [id number]

## 2011-03-01 NOTE — Consult Note (Signed)
[name], [name]                  ACCOUNT NO.:  [id number]   MEDICAL RECORD NO.:  [id number]          PATIENT TYPE:  INP   LOCATION:  6527                         FACILITY:  [facility]   PHYSICIAN:  Michael L. Reynolds, M.D.DATE OF BIRTH:  08-13-1920   DATE OF CONSULTATION:  08/29/2005  DATE OF DISCHARGE:                                   CONSULTATION   REFERRED BY:  Sherryl Manges, M.D.   REASON FOR CONSULTATION:  Stroke.   HISTORY OF PRESENT ILLNESS:  This is the initial inpatient consultation and  evaluation of this 77 year old man with a past medical history which until  recently was remarkable only for hypertension.  On the afternoon of August 24, 2005, he was at [employer] and had a witnessed episode in which he  slumped in his chair after a meal.  EMS was called and the patient was  taken to the emergency department at [facility] in [facility].  His  glucose was found to be 452, troponin I mildly elevated.  He reportedly had  a CT of the head at that time which was unremarkable, but is not available  for review.  He was discharged from the hospital on August 26, 2005, and  then saw a cardiologist in the area on August 27, 2005.  A 2-D  echocardiogram at that time showed hypokinesis of the left ventricular wall  with an ejection fraction of 45%.  It was suggested to him at that time that  he be admitted to the hospital in [place] but instead his daughter  brought him to [facility], as she lives in this area, for  a completion of his workup.  He was admitted to the hospital service and had  a consultation with [facility].  His workup has included carotid  duplex study which demonstrated no significant ICA stenosis and antegrade  vertebral artery flow, as well as a cardiac catheterization performed on  August 28, 2005, which demonstrated a 95% diagonal branch lesion on which  a cutting balloon angioplasty was successfully  performed.  The patient was  placed on aspirin and Plavix following the procedure.  He underwent an MRI  of the brain with an MRA and intracranial circulation today, which  demonstrated multiple strokes, which is further outlined below, and a  question of hemorrhagic component to at least one of these strokes was  raised.  A neurological consultation is requested for further medication  management.  Furthermore, the patient has been noted to be confused and  wandering around a lot during the hospital stay.  According to the chart,  his daughters have noticed progressive dementia and confusion.   PAST MEDICAL HISTORY:  As above.  He had not had a diagnosis of dementia  before arriving in the hospital.  There is no known history of strokes or  syncopal spells.   FAMILY HISTORY/SOCIAL HISTORY/REVIEW OF SYSTEMS:  Per the admission H&P by  Dr. Blenda Nicely on August 27, 2005, which is reviewed.  Also see the accompanying  medical student note.   HOME  MEDICATIONS:  Prior to admission he was taking Lisinopril and aspirin.   HOSPITAL MEDICATIONS:  1.  He is presently taking Coreg.  2.  Zocor.  3.  Lisinopril.  4.  Glucotrol.  5.  Lovenox.  6.  Aspirin.  7.  Protonix.  8.  Sliding scale insulin.   PHYSICAL EXAMINATION:  VITAL SIGNS:  Temperature 98.3 degrees, blood  pressure 155/83, pulse 63, respirations 18.  GENERAL:  This is a healthy-appearing male, in no distress.  HEENT:  Head normocephalic and atraumatic.  Oropharynx benign.  NECK:  Supple without carotid or supraclavicular bruits.  HEART:  Regular rate and rhythm without murmurs.  NEUROLOGIC:  Mental status:  He is awake and alert.  He is variably  oriented.  Per the medical student he was not oriented to place or to time,  but for me he said he was in a hospital in an unknown city in [place].  He correctly identified the month.  He has obvious short-term  memory deficits.  On examination unable to perform concentration tasks  or  recall tasks and repeating questions.  He is, however, able to name objects  and repeat phrases.  Mood is euthymic and affect appropriate.  Cranial  nerves x5 exam is benign.  Pupils equal and briskly reactive.  Extraocular  movements full without nystagmus.  On visual examination, I think he has a  little bit of neglect in the left lower quadrant.  His face and palate move  normally and symmetrically.  Facial sensation is intact to pinprick.  Hearing is intact to conversational speech.  Shoulder shrug and strength are  normal.  Motor testing:  Normal bulk and tone.  Normal strength in all  tested extremity muscles.  Sensation intact to light touch, joint position  and double simultaneous stimulation in all extremities.  Coordination:  Rapid movements are performed well.  Finger-to-nose and heel-to-shin are  performed well.  Gait:  He arises from the chair easily.  His stance is  normal.  He is able to ambulate without difficulty.  Reflexes are 2+ and  symmetric.  Toes are downgoing bilaterally.   LABORATORY DATA:  CBC:  White count 6.1, hemoglobin 12.8, platelets 184,000.  BMET is remarkable only for an elevated glucose of 244.  TSH is normal at  2.22.  Homocysteine level is slightly elevated.  RPR nonreactive.  Hemoglobin A1c is markedly elevated at 12.2.  Total cholesterol 209, with  HDL of 50 and LDL elevated at 140.   An MRI of the brain performed today is personally reviewed.  The study is  remarkable for small acute, subacute infarctions in both occipital lobes,  more temporal occipital on the left and closer to the occipital pole on the  right.  There is also an acute infarction in the right basal ganglia and  corona radiata.  The question of hemorrhagic component has been raised.  Also present are a moderate degree of atrophy and white matter disease.  The  MRA shows diffuse atherosclerotic changes, particular in the basilar artery.   IMPRESSION:  1.  Multiple subacute  embolic strokes.  This may have occurred following an      arrhythmia of cardiac emboli or possibly following his catheterization.      The question of associated hemorrhage has been raised.  This is subtle      on the MRI.  2.  Significant intracranial atherosclerotic disease.  3.  Strongly suspect underlying dementia.   PLAN:  Will  check a CT of the head to get a better look at the blood and  then can make recommendations about his antiplatelet therapy.  He already  has medications for his hyperlipidemia.  He needs better control of his risk  factors.  As far as his memory problems, it appears that he is not going to  be going home alone any time soon, which I think is an outstanding idea.  If  he does return to [facility], would strongly suggest a closely supervised  living situation.  He needs to be followed up for his memory loss by a  neurologist either here or there.  We will be happy to do that if  he stays in this area.  He is probably going to need to be placed on  medications for his memory following an outpatient evaluation.   Thank you for this consultation.      Michael L. Doy Mince, M.D.  Electronically Signed     MLR/MEDQ  D:  08/29/2005  T:  08/30/2005  Job:  [id number]   cc:   Sherryl Manges, M.D.

## 2011-03-14 ENCOUNTER — Other Ambulatory Visit: Payer: Self-pay | Admitting: Internal Medicine

## 2011-03-14 ENCOUNTER — Encounter (HOSPITAL_BASED_OUTPATIENT_CLINIC_OR_DEPARTMENT_OTHER): Payer: Medicare Other | Admitting: Internal Medicine

## 2011-03-14 DIAGNOSIS — D539 Nutritional anemia, unspecified: Secondary | ICD-10-CM

## 2011-03-14 LAB — CBC WITH DIFFERENTIAL/PLATELET
BASO%: 0.4 % (ref 0.0–2.0)
Basophils Absolute: 0 10*3/uL (ref 0.0–0.1)
EOS%: 2.6 % (ref 0.0–7.0)
HCT: 32.2 % — ABNORMAL LOW (ref 38.4–49.9)
HGB: 10.6 g/dL — ABNORMAL LOW (ref 13.0–17.1)
LYMPH%: 19.2 % (ref 14.0–49.0)
MCH: 30.3 pg (ref 27.2–33.4)
MCHC: 32.9 g/dL (ref 32.0–36.0)
MONO#: 0.8 10*3/uL (ref 0.1–0.9)
NEUT%: 66.4 % (ref 39.0–75.0)
Platelets: 166 10*3/uL (ref 140–400)
lymph#: 1.3 10*3/uL (ref 0.9–3.3)

## 2011-05-09 ENCOUNTER — Telehealth: Payer: Self-pay | Admitting: Cardiovascular Disease

## 2011-05-09 NOTE — Telephone Encounter (Signed)
LOV,12 faxed to [facility] @ [phone]  05/09/11/km

## 2011-10-22 DIAGNOSIS — Z23 Encounter for immunization: Secondary | ICD-10-CM | POA: Diagnosis not present

## 2011-10-22 DIAGNOSIS — E039 Hypothyroidism, unspecified: Secondary | ICD-10-CM | POA: Diagnosis not present

## 2012-01-14 DIAGNOSIS — E119 Type 2 diabetes mellitus without complications: Secondary | ICD-10-CM | POA: Diagnosis not present

## 2012-01-14 DIAGNOSIS — E039 Hypothyroidism, unspecified: Secondary | ICD-10-CM | POA: Diagnosis not present

## 2012-01-14 DIAGNOSIS — I10 Essential (primary) hypertension: Secondary | ICD-10-CM | POA: Diagnosis not present

## 2012-01-14 DIAGNOSIS — I2510 Atherosclerotic heart disease of native coronary artery without angina pectoris: Secondary | ICD-10-CM | POA: Diagnosis not present

## 2012-01-29 DIAGNOSIS — H35379 Puckering of macula, unspecified eye: Secondary | ICD-10-CM | POA: Diagnosis not present

## 2012-01-29 DIAGNOSIS — H35729 Serous detachment of retinal pigment epithelium, unspecified eye: Secondary | ICD-10-CM | POA: Diagnosis not present

## 2012-01-29 DIAGNOSIS — H4720 Unspecified optic atrophy: Secondary | ICD-10-CM | POA: Diagnosis not present

## 2012-01-29 DIAGNOSIS — H353190 Nonexudative age-related macular degeneration, unspecified eye, stage unspecified: Secondary | ICD-10-CM | POA: Diagnosis not present

## 2012-01-29 DIAGNOSIS — Z961 Presence of intraocular lens: Secondary | ICD-10-CM | POA: Diagnosis not present

## 2012-02-27 DIAGNOSIS — I10 Essential (primary) hypertension: Secondary | ICD-10-CM | POA: Diagnosis not present

## 2012-02-27 DIAGNOSIS — E039 Hypothyroidism, unspecified: Secondary | ICD-10-CM | POA: Diagnosis not present

## 2012-02-27 DIAGNOSIS — E119 Type 2 diabetes mellitus without complications: Secondary | ICD-10-CM | POA: Diagnosis not present

## 2012-02-27 DIAGNOSIS — I2510 Atherosclerotic heart disease of native coronary artery without angina pectoris: Secondary | ICD-10-CM | POA: Diagnosis not present

## 2012-05-14 DIAGNOSIS — IMO0002 Reserved for concepts with insufficient information to code with codable children: Secondary | ICD-10-CM | POA: Diagnosis not present

## 2012-06-16 DIAGNOSIS — H10029 Other mucopurulent conjunctivitis, unspecified eye: Secondary | ICD-10-CM | POA: Diagnosis not present

## 2012-07-02 DIAGNOSIS — E785 Hyperlipidemia, unspecified: Secondary | ICD-10-CM | POA: Diagnosis not present

## 2012-07-02 DIAGNOSIS — I2510 Atherosclerotic heart disease of native coronary artery without angina pectoris: Secondary | ICD-10-CM | POA: Diagnosis not present

## 2012-07-02 DIAGNOSIS — E119 Type 2 diabetes mellitus without complications: Secondary | ICD-10-CM | POA: Diagnosis not present

## 2012-07-02 DIAGNOSIS — I10 Essential (primary) hypertension: Secondary | ICD-10-CM | POA: Diagnosis not present

## 2012-07-15 DIAGNOSIS — M109 Gout, unspecified: Secondary | ICD-10-CM | POA: Diagnosis not present

## 2012-07-15 DIAGNOSIS — E119 Type 2 diabetes mellitus without complications: Secondary | ICD-10-CM | POA: Diagnosis not present

## 2012-07-15 DIAGNOSIS — E782 Mixed hyperlipidemia: Secondary | ICD-10-CM | POA: Diagnosis not present

## 2012-07-15 DIAGNOSIS — I2510 Atherosclerotic heart disease of native coronary artery without angina pectoris: Secondary | ICD-10-CM | POA: Diagnosis not present

## 2012-07-15 DIAGNOSIS — F0390 Unspecified dementia without behavioral disturbance: Secondary | ICD-10-CM | POA: Diagnosis not present

## 2012-07-15 DIAGNOSIS — I10 Essential (primary) hypertension: Secondary | ICD-10-CM | POA: Diagnosis not present

## 2012-07-15 DIAGNOSIS — Z8546 Personal history of malignant neoplasm of prostate: Secondary | ICD-10-CM | POA: Diagnosis not present

## 2012-07-15 DIAGNOSIS — R32 Unspecified urinary incontinence: Secondary | ICD-10-CM | POA: Diagnosis not present

## 2012-07-16 DIAGNOSIS — Z23 Encounter for immunization: Secondary | ICD-10-CM | POA: Diagnosis not present

## 2012-09-05 DIAGNOSIS — J069 Acute upper respiratory infection, unspecified: Secondary | ICD-10-CM | POA: Diagnosis not present

## 2012-09-07 ENCOUNTER — Ambulatory Visit
Admission: RE | Admit: 2012-09-07 | Discharge: 2012-09-07 | Disposition: A | Discharge status: Home / Self Care | Payer: Medicare Other | Source: Ambulatory Visit | Attending: Family Medicine | Admitting: Family Medicine

## 2012-09-07 ENCOUNTER — Other Ambulatory Visit: Payer: Self-pay | Admitting: Family Medicine

## 2012-09-07 DIAGNOSIS — R600 Localized edema: Secondary | ICD-10-CM

## 2012-09-07 DIAGNOSIS — R609 Edema, unspecified: Secondary | ICD-10-CM | POA: Diagnosis not present

## 2012-10-20 DIAGNOSIS — E119 Type 2 diabetes mellitus without complications: Secondary | ICD-10-CM | POA: Diagnosis not present

## 2012-10-20 DIAGNOSIS — E782 Mixed hyperlipidemia: Secondary | ICD-10-CM | POA: Diagnosis not present

## 2012-10-20 DIAGNOSIS — I10 Essential (primary) hypertension: Secondary | ICD-10-CM | POA: Diagnosis not present

## 2012-10-20 DIAGNOSIS — R142 Eructation: Secondary | ICD-10-CM | POA: Diagnosis not present

## 2012-10-20 DIAGNOSIS — F0390 Unspecified dementia without behavioral disturbance: Secondary | ICD-10-CM | POA: Diagnosis not present

## 2012-10-20 DIAGNOSIS — I2510 Atherosclerotic heart disease of native coronary artery without angina pectoris: Secondary | ICD-10-CM | POA: Diagnosis not present

## 2012-10-20 DIAGNOSIS — R141 Gas pain: Secondary | ICD-10-CM | POA: Diagnosis not present

## 2012-10-20 DIAGNOSIS — E039 Hypothyroidism, unspecified: Secondary | ICD-10-CM | POA: Diagnosis not present

## 2013-01-09 DIAGNOSIS — R21 Rash and other nonspecific skin eruption: Secondary | ICD-10-CM | POA: Diagnosis not present

## 2013-01-18 ENCOUNTER — Observation Stay (HOSPITAL_COMMUNITY)
Admission: EM | Admit: 2013-01-18 | Discharge: 2013-01-19 | Disposition: A | Discharge status: Home / Self Care | Payer: Medicare Other | Attending: Emergency Medicine | Admitting: Emergency Medicine

## 2013-01-18 DIAGNOSIS — F0390 Unspecified dementia without behavioral disturbance: Secondary | ICD-10-CM | POA: Diagnosis not present

## 2013-01-18 DIAGNOSIS — I10 Essential (primary) hypertension: Secondary | ICD-10-CM

## 2013-01-18 DIAGNOSIS — I2510 Atherosclerotic heart disease of native coronary artery without angina pectoris: Secondary | ICD-10-CM

## 2013-01-18 DIAGNOSIS — E785 Hyperlipidemia, unspecified: Secondary | ICD-10-CM | POA: Diagnosis not present

## 2013-01-18 DIAGNOSIS — N183 Chronic kidney disease, stage 3 unspecified: Secondary | ICD-10-CM | POA: Diagnosis not present

## 2013-01-18 DIAGNOSIS — I129 Hypertensive chronic kidney disease with stage 1 through stage 4 chronic kidney disease, or unspecified chronic kidney disease: Secondary | ICD-10-CM | POA: Diagnosis not present

## 2013-01-18 DIAGNOSIS — R079 Chest pain, unspecified: Principal | ICD-10-CM | POA: Insufficient documentation

## 2013-01-18 DIAGNOSIS — E119 Type 2 diabetes mellitus without complications: Secondary | ICD-10-CM | POA: Insufficient documentation

## 2013-01-18 DIAGNOSIS — M79609 Pain in unspecified limb: Secondary | ICD-10-CM | POA: Insufficient documentation

## 2013-01-18 DIAGNOSIS — J984 Other disorders of lung: Secondary | ICD-10-CM | POA: Diagnosis not present

## 2013-01-18 DIAGNOSIS — R4182 Altered mental status, unspecified: Secondary | ICD-10-CM | POA: Diagnosis not present

## 2013-01-18 NOTE — ED Notes (Signed)
EMS states was called out to SNF to see pt for chest pain. EMS states pt has given multiple sites of pain, left arm and chest then states he is not having any pain. EMS also states pt was combative in route, pt is calm at this time.

## 2013-01-19 ENCOUNTER — Emergency Department (HOSPITAL_COMMUNITY): Payer: Medicare Other

## 2013-01-19 DIAGNOSIS — I10 Essential (primary) hypertension: Secondary | ICD-10-CM

## 2013-01-19 DIAGNOSIS — J984 Other disorders of lung: Secondary | ICD-10-CM | POA: Diagnosis not present

## 2013-01-19 DIAGNOSIS — R079 Chest pain, unspecified: Secondary | ICD-10-CM | POA: Diagnosis not present

## 2013-01-19 DIAGNOSIS — I2510 Atherosclerotic heart disease of native coronary artery without angina pectoris: Secondary | ICD-10-CM

## 2013-01-19 DIAGNOSIS — F0390 Unspecified dementia without behavioral disturbance: Secondary | ICD-10-CM | POA: Diagnosis not present

## 2013-01-19 LAB — CBC
HCT: 27 % — ABNORMAL LOW (ref 39.0–52.0)
Hemoglobin: 9.1 g/dL — ABNORMAL LOW (ref 13.0–17.0)
MCV: 87.9 fL (ref 78.0–100.0)
RDW: 15.2 % (ref 11.5–15.5)
WBC: 7.4 10*3/uL (ref 4.0–10.5)

## 2013-01-19 LAB — COMPREHENSIVE METABOLIC PANEL
Albumin: 3 g/dL — ABNORMAL LOW (ref 3.5–5.2)
Alkaline Phosphatase: 61 U/L (ref 39–117)
BUN: 46 mg/dL — ABNORMAL HIGH (ref 6–23)
Chloride: 101 mEq/L (ref 96–112)
Creatinine, Ser: 1.48 mg/dL — ABNORMAL HIGH (ref 0.50–1.35)
GFR calc Af Amer: 46 mL/min — ABNORMAL LOW (ref >90)
GFR calc non Af Amer: 40 mL/min — ABNORMAL LOW (ref >90)
Glucose, Bld: 158 mg/dL — ABNORMAL HIGH (ref 70–99)
Potassium: 3.8 mEq/L (ref 3.5–5.1)
Total Bilirubin: 0.2 mg/dL — ABNORMAL LOW (ref 0.3–1.2)

## 2013-01-19 LAB — POCT I-STAT TROPONIN I: Troponin i, poc: 0.01 ng/mL (ref 0.00–0.08)

## 2013-01-19 MED ORDER — ASPIRIN 81 MG PO CHEW
324.0000 mg | CHEWABLE_TABLET | Freq: Once | ORAL | Status: AC
  Administered 2013-01-19: 324 mg via ORAL
  Filled 2013-01-19: qty 4

## 2013-01-19 NOTE — ED Notes (Signed)
Report received, assumed care.  

## 2013-01-19 NOTE — Consult Note (Signed)
Patient's PCP: Florina Ou, MD Referring physician: Dr. Mitzi Hansen  Chief Complaint: Chest pain  History of Present Illness: [name] is a 79 y.o. Caucasian male with history of advanced dementia, hypertension, coronary disease, hyperlipidemia, diabetes, chronic kidney disease stage III, and anemia who presents with the above complaints.  Due to patient's advanced dementia and he is not a reliable historian.  It was reported that apparently at [facility] patient complained of chest pain.  It is not known where the patient had chest pain and for how long he had it.  He was transferred to the ER for further evaluation.  The hospitalist service was consulted for further care and management.  Currently patient denies any chest pain or difficulty breathing.  Review of Systems: Limited due to patient's dementia.  Past Medical History  Diagnosis Date  . Hypertension   . CAD (coronary artery disease)   . Hyperlipidemia   . DM (diabetes mellitus)   . Dementia   . Anemia   . Gout   . Prostate cancer    Past Surgical History  Procedure Laterality Date  . Finger surgery  1960  . Cataract extraction  2000   Family History  Problem Relation Age of Onset  . Heart disease Brother   . Diabetes Brother   . Hypertension Mother   . Colon cancer Neg Hx    History   Social History  . Marital Status: Widowed    Spouse Name: N/A    Number of Children: 2  . Years of Education: N/A   Occupational History  . retired    Social History Main Topics  . Smoking status: Never Smoker   . Smokeless tobacco: Never Used  . Alcohol Use: No  . Drug Use: No  . Sexually Active: Not on file   Other Topics Concern  . Not on file   Social History Narrative  . No narrative on file   Allergies: Review of patient's allergies indicates no known allergies.  Home Meds: Prior to Admission medications   Medication Sig Start Date End Date Taking? Authorizing Provider  amLODipine (NORVASC) 2.5 MG tablet  Take 2.5 mg by mouth daily.     Yes Historical Provider, MD  aspirin 81 MG chewable tablet Chew 81 mg by mouth daily.   Yes Historical Provider, MD  calcium-vitamin D (OSCAL WITH D) 500-200 MG-UNIT per tablet Take 1 tablet by mouth daily.   Yes Historical Provider, MD  carvedilol (COREG) 12.5 MG tablet Take 12.5 mg by mouth 2 (two) times daily with a meal.     Yes Historical Provider, MD  ezetimibe-simvastatin (VYTORIN) 10-10 MG per tablet Take 1 tablet by mouth at bedtime.     Yes Historical Provider, MD  glipiZIDE (GLUCOTROL) 10 MG tablet Take 10 mg by mouth 3 (three) times daily with meals.   Yes Historical Provider, MD  haloperidol (HALDOL) 0.5 MG tablet Take 0.5 mg by mouth at bedtime.    Yes Historical Provider, MD  hydrochlorothiazide 25 MG tablet Take 25 mg by mouth daily.     Yes Historical Provider, MD  insulin aspart protamine-insulin aspart (NOVOLOG 70/30) (70-30) 100 UNIT/ML injection Inject 7 Units into the skin daily with lunch.   Yes Historical Provider, MD  levothyroxine (SYNTHROID, LEVOTHROID) 25 MCG tablet Take 25 mcg by mouth daily before breakfast.   Yes Historical Provider, MD  lisinopril (PRINIVIL,ZESTRIL) 40 MG tablet Take 40 mg by mouth daily.     Yes Historical Provider, MD  sitaGLIPtin (JANUVIA)  100 MG tablet Take 100 mg by mouth daily.   Yes Historical Provider, MD  Specialty Vitamins Products (ICAPS LUTEIN-ZEAXANTHIN PO) Take 1 tablet by mouth 2 (two) times daily.     Yes Historical Provider, MD  vitamin B-12 (CYANOCOBALAMIN) 1000 MCG tablet Take 1,000 mcg by mouth daily.   Yes Historical Provider, MD  acetaminophen (MAPAP) 325 MG tablet Take 650 mg by mouth every 6 (six) hours as needed for pain.     Historical Provider, MD    Physical Exam: Blood pressure 158/71, pulse 66, temperature 98.5 F (36.9 C), temperature source Oral, resp. rate 13, SpO2 95.00%. General: Sleeping but easily arousable, No acute distress. HEENT: Eyes closed, Moist mucous membranes Neck:  Supple CV: S1 and S2 Lungs: Clear to ascultation bilaterally Abdomen: Soft, Nontender, Nondistended, +bowel sounds. Ext: Good pulses. Trace edema. No clubbing or cyanosis noted. Neuro: Cranial Nerves grossly intact.  Moving all of his extremities.  Lab results:  Recent Labs  01/19/13 0035  NA 136  K 3.8  CL 101  CO2 26  GLUCOSE 158*  BUN 46*  CREATININE 1.48*  CALCIUM 8.6    Recent Labs  01/19/13 0035  AST 23  ALT 13  ALKPHOS 61  BILITOT 0.2*  PROT 6.2  ALBUMIN 3.0*   No results found for this basename: LIPASE, AMYLASE,  in the last 72 hours  Recent Labs  01/19/13 0035  WBC 7.4  HGB 9.1*  HCT 27.0*  MCV 87.9  PLT 141*   No results found for this basename: CKTOTAL, CKMB, CKMBINDEX, TROPONINI,  in the last 72 hours No components found with this basename: POCBNP,  No results found for this basename: DDIMER,  in the last 72 hours No results found for this basename: HGBA1C,  in the last 72 hours No results found for this basename: CHOL, HDL, LDLCALC, TRIG, CHOLHDL, LDLDIRECT,  in the last 72 hours No results found for this basename: TSH, T4TOTAL, FREET3, T3FREE, THYROIDAB,  in the last 72 hours No results found for this basename: VITAMINB12, FOLATE, FERRITIN, TIBC, IRON, RETICCTPCT,  in the last 72 hours Imaging results:  Dg Chest 2 View  01/19/2013  *RADIOLOGY REPORT*  Clinical Data: Left arm and chest pain.  CHEST - 2 VIEW  Comparison: Chest radiograph performed 08/27/2005, and CTA of the chest performed 08/29/2005  Findings: The lungs are well-aerated.  Minimal left basilar opacity may reflect atelectasis.  There is no evidence of pleural effusion or pneumothorax.  The heart is normal in size; the mediastinal contour is within normal limits.  No acute osseous abnormalities are seen.  IMPRESSION: Minimal left basilar opacity may reflect atelectasis; lungs otherwise clear.   Original Report Authenticated By: Santa Lighter, M.D.    Other results: EKG: Normal sinus  rhythm with heart rate of 67.  Assessment & Plan by Problem: Chest pain with history of coronary artery disease Etiology unclear.  EKG shows normal sinus rhythm without any ischemic changes.  Initial point-of-care troponins negative.  I had an extensive discussion with patient's daughter who is also the healthcare power of attorney.  She does not wish to any aggressive cardiac workup including stress tests or cardiac catheter.  Will check a second set of troponin and if negative will discharge the patient back to [facility].  Risks and benefits were discussed with patient's family and given patient's age they accepted the risks and preferred conservative management.  Continue aspirin.  Continue carvedilol.  Hypertension Continue home antihypertensive medications.  Diabetes uncontrolled with  renal complications Continue home diabetic medications.  Chronic kidney disease stage III Renal function close to baseline, last available creatinine was in 2012 which was 1.2.  Anemia Likely due to chronic kidney disease.  Patient has had significant anemia since 2010.  Hemoglobin at baseline.  Advanced dementia Continue home medications.  CODE STATUS DO NOT RESUSCITATE/DO NOT INTUBATE.  This was discussed with patient's daughter Bernita Buffy, HPOA.  In the emergency department.  Plan was discussed with Dr. Mitzi Hansen, ED physician.  Ailana Cuadrado A, MD 01/19/2013, 3:39 AM

## 2013-01-19 NOTE — ED Notes (Signed)
Admitting MD at bedside, pt awaiting inpt beds assignment.

## 2013-01-19 NOTE — ED Provider Notes (Addendum)
History     CSN: [id number]  Arrival date & time 01/18/13  2239   First MD Initiated Contact with Patient 01/18/13 2332      Chief Complaint  Patient presents with  . Chest Pain   Level V caveat for dementia HPI [name] is a 79 y.o. male history of coronary artery disease and severe dementia who is status post cutting balloon angioplasty in 2006 presents with chest pain.  According to nursing home staff was complaining about chest pain earlier, told EMS he is having chest pain including left arm and chest.  Patient currently denies any pain.  He is alert and oriented only to himself.    Past Medical History  Diagnosis Date  . Hypertension   . CAD (coronary artery disease)   . Hyperlipidemia   . DM (diabetes mellitus)   . Dementia   . Anemia   . Gout   . Prostate cancer     Past Surgical History  Procedure Laterality Date  . Finger surgery  1960  . Cataract extraction  2000    Family History  Problem Relation Age of Onset  . Heart disease Brother   . Diabetes Brother   . Hypertension Mother   . Colon cancer Neg Hx     History  Substance Use Topics  . Smoking status: Never Smoker   . Smokeless tobacco: Never Used  . Alcohol Use: No      Review of Systems Level V caveat for severe dementia Allergies  Review of patient's allergies indicates no known allergies.  Home Medications   Current Outpatient Rx  Name  Route  Sig  Dispense  Refill  . amLODipine (NORVASC) 2.5 MG tablet   Oral   Take 2.5 mg by mouth daily.           Marland Kitchen aspirin 81 MG chewable tablet   Oral   Chew 81 mg by mouth daily.         . calcium-vitamin D (OSCAL WITH D) 500-200 MG-UNIT per tablet   Oral   Take 1 tablet by mouth daily.         . carvedilol (COREG) 12.5 MG tablet   Oral   Take 12.5 mg by mouth 2 (two) times daily with a meal.           . ezetimibe-simvastatin (VYTORIN) 10-10 MG per tablet   Oral   Take 1 tablet by mouth at bedtime.           Marland Kitchen glipiZIDE  (GLUCOTROL) 10 MG tablet   Oral   Take 10 mg by mouth 3 (three) times daily with meals.         . haloperidol (HALDOL) 0.5 MG tablet   Oral   Take 0.5 mg by mouth at bedtime.          . hydrochlorothiazide 25 MG tablet   Oral   Take 25 mg by mouth daily.           . insulin aspart protamine-insulin aspart (NOVOLOG 70/30) (70-30) 100 UNIT/ML injection   Subcutaneous   Inject 7 Units into the skin daily with lunch.         . levothyroxine (SYNTHROID, LEVOTHROID) 25 MCG tablet   Oral   Take 25 mcg by mouth daily before breakfast.         . lisinopril (PRINIVIL,ZESTRIL) 40 MG tablet   Oral   Take 40 mg by mouth daily.           Marland Kitchen  sitaGLIPtin (JANUVIA) 100 MG tablet   Oral   Take 100 mg by mouth daily.         Marland Kitchen Specialty Vitamins Products (ICAPS LUTEIN-ZEAXANTHIN PO)   Oral   Take 1 tablet by mouth 2 (two) times daily.           . vitamin B-12 (CYANOCOBALAMIN) 1000 MCG tablet   Oral   Take 1,000 mcg by mouth daily.         Marland Kitchen acetaminophen (MAPAP) 325 MG tablet   Oral   Take 650 mg by mouth every 6 (six) hours as needed for pain.            BP 162/67  Pulse 66  Temp(Src) 98.5 F (36.9 C) (Oral)  Resp 14  SpO2 95%  Physical Exam  Nursing notes reviewed.  Electronic medical record reviewed. VITAL SIGNS:   Filed Vitals:   01/19/13 0300 01/19/13 0400 01/19/13 0500 01/19/13 0600  BP: 133/52 150/63 156/55 161/62  Pulse: 37   61  Temp:      TempSrc:      Resp: 12 14 11 25   SpO2: 89%   97%   CONSTITUTIONAL: Awake, oriented to self only, appears non-toxic HENT: Atraumatic, normocephalic, oral mucosa pink and moist, airway patent. Nares patent without drainage. External ears normal. EYES: Conjunctiva clear, EOMI, PERRLA NECK: Trachea midline, non-tender, supple CARDIOVASCULAR: Normal heart rate, Normal rhythm, No murmurs, rubs, gallops PULMONARY/CHEST: Clear to auscultation, no rhonchi, wheezes, or rales. Symmetrical breath sounds.  Non-tender. ABDOMINAL: Non-distended, soft, non-tender - no rebound or guarding.  BS normal. NEUROLOGIC: Non-focal, moving all four extremities, no gross sensory or motor deficits. EXTREMITIES: No clubbing, cyanosis, or edema SKIN: Warm, Dry, No erythema, No rash  ED Course  Procedures (including critical care time)  Date: 01/19/2013  Rate: 67  Rhythm: normal sinus rhythm  QRS Axis: normal  Intervals: normal  ST/T Wave abnormalities: normal  Conduction Disutrbances: none  Narrative Interpretation: unremarkable     Labs Reviewed  CBC - Abnormal; Notable for the following:    RBC 3.07 (*)    Hemoglobin 9.1 (*)    HCT 27.0 (*)    Platelets 141 (*)    All other components within normal limits  COMPREHENSIVE METABOLIC PANEL - Abnormal; Notable for the following:    Glucose, Bld 158 (*)    BUN 46 (*)    Creatinine, Ser 1.48 (*)    Albumin 3.0 (*)    Total Bilirubin 0.2 (*)    GFR calc non Af Amer 40 (*)    GFR calc Af Amer 46 (*)    All other components within normal limits  TROPONIN I  URINALYSIS, ROUTINE W REFLEX MICROSCOPIC  TROPONIN I  POCT I-STAT TROPONIN I   Dg Chest 2 View  01/19/2013  *RADIOLOGY REPORT*  Clinical Data: Left arm and chest pain.  CHEST - 2 VIEW  Comparison: Chest radiograph performed 08/27/2005, and CTA of the chest performed 08/29/2005  Findings: The lungs are well-aerated.  Minimal left basilar opacity may reflect atelectasis.  There is no evidence of pleural effusion or pneumothorax.  The heart is normal in size; the mediastinal contour is within normal limits.  No acute osseous abnormalities are seen.  IMPRESSION: Minimal left basilar opacity may reflect atelectasis; lungs otherwise clear.   Original Report Authenticated By: Santa Lighter, M.D.      1. Chest pain   2. Dementia   3. CAD, NATIVE VESSEL   4. Unspecified essential hypertension  MDM  [name] is a 79 y.o. male with complaints of chest pain from his skilled nursing facility.   Patient at this time gives me no history or complaints about pain. Initial EKG and troponin as well as rest of his labs are fairly unremarkable - he has chronic renal disease, but renal function seems to be at baseline. No elevated white count, no evidence for pneumonia. Patient is afebrile, nontoxic. Patient's vital signs show some hypertension 150s 160s.  Patient is in no acute distress. Patient's daughter is at the bedside, had a discussion with the patient'daughter who is the health care power of attorney - patient wishes to be DO NOT RESUSCITATE.    Initially was going to admit the patient, but in further discussion with Dr. Reece Levy the hospitalist and patient's family, they would like to do a ER troponin rule out, they do not want to pursue any further intervention such as stress testing.  We'll obtain another troponin at 06 30, if this is negative patient will be dispositioned to return to a skilled nursing facility.  No evidence for pneumothorax, perforated viscus, patient's abdomen is soft and nontender, I doubt pericarditis at this time there is no HA G. evidence for the same, patient is afebrile and nontoxic I doubt  Pneumonia or myocarditis as well.   Patient's second troponin is negative, do not think this patient's chest pain was secondary to MI, and in fact is difficult to ascertain whether he was having chest pain at all. Per conversations with the family we have excluded emergent cause of chest pain and they do not wish to be aggressive in pursuing further workup or interventions. We'll discharge the patient home stable and in good condition to his skilled nursing facility.      Rhunette Croft, MD 01/19/13 [id number]  Rhunette Croft, MD 01/19/13 [id number]

## 2013-01-19 NOTE — ED Notes (Signed)
Pt sleeping, arousable, NAD, calm, resps e/u, VSS.

## 2013-01-19 NOTE — ED Notes (Signed)
Pt now awake, alert, NAD, calm, interactive, lab at [facility].

## 2013-01-19 NOTE — ED Notes (Signed)
Bernita Buffy (daughter) [phone]

## 2013-01-21 DIAGNOSIS — E039 Hypothyroidism, unspecified: Secondary | ICD-10-CM | POA: Diagnosis not present

## 2013-01-21 DIAGNOSIS — I10 Essential (primary) hypertension: Secondary | ICD-10-CM | POA: Diagnosis not present

## 2013-01-21 DIAGNOSIS — F0390 Unspecified dementia without behavioral disturbance: Secondary | ICD-10-CM | POA: Diagnosis not present

## 2013-01-21 DIAGNOSIS — E1129 Type 2 diabetes mellitus with other diabetic kidney complication: Secondary | ICD-10-CM | POA: Diagnosis not present

## 2013-01-21 DIAGNOSIS — D539 Nutritional anemia, unspecified: Secondary | ICD-10-CM | POA: Diagnosis not present

## 2013-01-21 DIAGNOSIS — E782 Mixed hyperlipidemia: Secondary | ICD-10-CM | POA: Diagnosis not present

## 2013-01-21 DIAGNOSIS — D649 Anemia, unspecified: Secondary | ICD-10-CM | POA: Diagnosis not present

## 2013-01-21 DIAGNOSIS — E119 Type 2 diabetes mellitus without complications: Secondary | ICD-10-CM | POA: Diagnosis not present

## 2013-01-29 DIAGNOSIS — H353190 Nonexudative age-related macular degeneration, unspecified eye, stage unspecified: Secondary | ICD-10-CM | POA: Diagnosis not present

## 2013-01-29 DIAGNOSIS — Z961 Presence of intraocular lens: Secondary | ICD-10-CM | POA: Diagnosis not present

## 2013-01-29 DIAGNOSIS — H35379 Puckering of macula, unspecified eye: Secondary | ICD-10-CM | POA: Diagnosis not present

## 2013-03-20 DIAGNOSIS — B360 Pityriasis versicolor: Secondary | ICD-10-CM | POA: Diagnosis not present

## 2013-03-20 DIAGNOSIS — L259 Unspecified contact dermatitis, unspecified cause: Secondary | ICD-10-CM | POA: Diagnosis not present

## 2013-03-30 DIAGNOSIS — D0460 Carcinoma in situ of skin of unspecified upper limb, including shoulder: Secondary | ICD-10-CM | POA: Diagnosis not present

## 2013-03-30 DIAGNOSIS — L570 Actinic keratosis: Secondary | ICD-10-CM | POA: Diagnosis not present

## 2013-03-30 DIAGNOSIS — B86 Scabies: Secondary | ICD-10-CM | POA: Diagnosis not present

## 2013-04-20 DIAGNOSIS — Z85828 Personal history of other malignant neoplasm of skin: Secondary | ICD-10-CM | POA: Diagnosis not present

## 2013-04-20 DIAGNOSIS — L259 Unspecified contact dermatitis, unspecified cause: Secondary | ICD-10-CM | POA: Diagnosis not present

## 2013-04-20 DIAGNOSIS — C44320 Squamous cell carcinoma of skin of unspecified parts of face: Secondary | ICD-10-CM | POA: Diagnosis not present

## 2013-04-26 DIAGNOSIS — D649 Anemia, unspecified: Secondary | ICD-10-CM | POA: Diagnosis not present

## 2013-04-26 DIAGNOSIS — E782 Mixed hyperlipidemia: Secondary | ICD-10-CM | POA: Diagnosis not present

## 2013-04-26 DIAGNOSIS — I10 Essential (primary) hypertension: Secondary | ICD-10-CM | POA: Diagnosis not present

## 2013-04-26 DIAGNOSIS — E1129 Type 2 diabetes mellitus with other diabetic kidney complication: Secondary | ICD-10-CM | POA: Diagnosis not present

## 2013-04-26 DIAGNOSIS — E039 Hypothyroidism, unspecified: Secondary | ICD-10-CM | POA: Diagnosis not present

## 2013-04-26 DIAGNOSIS — E119 Type 2 diabetes mellitus without complications: Secondary | ICD-10-CM | POA: Diagnosis not present

## 2013-05-13 DIAGNOSIS — N289 Disorder of kidney and ureter, unspecified: Secondary | ICD-10-CM | POA: Diagnosis not present

## 2013-06-22 DIAGNOSIS — Z85828 Personal history of other malignant neoplasm of skin: Secondary | ICD-10-CM | POA: Diagnosis not present

## 2013-06-22 DIAGNOSIS — D0460 Carcinoma in situ of skin of unspecified upper limb, including shoulder: Secondary | ICD-10-CM | POA: Diagnosis not present

## 2013-07-19 DIAGNOSIS — R7309 Other abnormal glucose: Secondary | ICD-10-CM | POA: Diagnosis not present

## 2013-07-20 DIAGNOSIS — N390 Urinary tract infection, site not specified: Secondary | ICD-10-CM | POA: Diagnosis not present

## 2013-07-21 DIAGNOSIS — C44320 Squamous cell carcinoma of skin of unspecified parts of face: Secondary | ICD-10-CM | POA: Diagnosis not present

## 2013-07-21 DIAGNOSIS — B86 Scabies: Secondary | ICD-10-CM | POA: Diagnosis not present

## 2013-07-21 DIAGNOSIS — D0460 Carcinoma in situ of skin of unspecified upper limb, including shoulder: Secondary | ICD-10-CM | POA: Diagnosis not present

## 2013-07-21 DIAGNOSIS — I872 Venous insufficiency (chronic) (peripheral): Secondary | ICD-10-CM | POA: Diagnosis not present

## 2013-08-13 DIAGNOSIS — B86 Scabies: Secondary | ICD-10-CM | POA: Diagnosis not present

## 2013-08-13 DIAGNOSIS — Z85828 Personal history of other malignant neoplasm of skin: Secondary | ICD-10-CM | POA: Diagnosis not present

## 2013-08-13 DIAGNOSIS — C44320 Squamous cell carcinoma of skin of unspecified parts of face: Secondary | ICD-10-CM | POA: Diagnosis not present

## 2013-08-27 DIAGNOSIS — E039 Hypothyroidism, unspecified: Secondary | ICD-10-CM | POA: Diagnosis not present

## 2013-08-27 DIAGNOSIS — F0390 Unspecified dementia without behavioral disturbance: Secondary | ICD-10-CM | POA: Diagnosis not present

## 2013-08-27 DIAGNOSIS — E782 Mixed hyperlipidemia: Secondary | ICD-10-CM | POA: Diagnosis not present

## 2013-08-27 DIAGNOSIS — I10 Essential (primary) hypertension: Secondary | ICD-10-CM | POA: Diagnosis not present

## 2013-08-27 DIAGNOSIS — E1129 Type 2 diabetes mellitus with other diabetic kidney complication: Secondary | ICD-10-CM | POA: Diagnosis not present

## 2013-08-31 DIAGNOSIS — Z23 Encounter for immunization: Secondary | ICD-10-CM | POA: Diagnosis not present

## 2013-09-04 ENCOUNTER — Emergency Department (HOSPITAL_COMMUNITY)
Admission: EM | Admit: 2013-09-04 | Discharge: 2013-09-04 | Disposition: A | Discharge status: Home / Self Care | Payer: Medicare Other | Attending: Emergency Medicine | Admitting: Emergency Medicine

## 2013-09-04 ENCOUNTER — Encounter (HOSPITAL_COMMUNITY): Payer: Self-pay | Admitting: Emergency Medicine

## 2013-09-04 DIAGNOSIS — Z7982 Long term (current) use of aspirin: Secondary | ICD-10-CM | POA: Diagnosis not present

## 2013-09-04 DIAGNOSIS — E1169 Type 2 diabetes mellitus with other specified complication: Secondary | ICD-10-CM | POA: Insufficient documentation

## 2013-09-04 DIAGNOSIS — D649 Anemia, unspecified: Secondary | ICD-10-CM | POA: Insufficient documentation

## 2013-09-04 DIAGNOSIS — F0390 Unspecified dementia without behavioral disturbance: Secondary | ICD-10-CM | POA: Diagnosis not present

## 2013-09-04 DIAGNOSIS — Z8546 Personal history of malignant neoplasm of prostate: Secondary | ICD-10-CM | POA: Insufficient documentation

## 2013-09-04 DIAGNOSIS — I10 Essential (primary) hypertension: Secondary | ICD-10-CM | POA: Diagnosis not present

## 2013-09-04 DIAGNOSIS — E161 Other hypoglycemia: Secondary | ICD-10-CM | POA: Diagnosis not present

## 2013-09-04 DIAGNOSIS — E162 Hypoglycemia, unspecified: Secondary | ICD-10-CM | POA: Diagnosis not present

## 2013-09-04 DIAGNOSIS — R7301 Impaired fasting glucose: Secondary | ICD-10-CM | POA: Diagnosis not present

## 2013-09-04 DIAGNOSIS — Z794 Long term (current) use of insulin: Secondary | ICD-10-CM | POA: Insufficient documentation

## 2013-09-04 DIAGNOSIS — E118 Type 2 diabetes mellitus with unspecified complications: Secondary | ICD-10-CM | POA: Diagnosis not present

## 2013-09-04 DIAGNOSIS — I2510 Atherosclerotic heart disease of native coronary artery without angina pectoris: Secondary | ICD-10-CM | POA: Diagnosis not present

## 2013-09-04 DIAGNOSIS — Z79899 Other long term (current) drug therapy: Secondary | ICD-10-CM | POA: Diagnosis not present

## 2013-09-04 LAB — GLUCOSE, CAPILLARY

## 2013-09-04 NOTE — ED Notes (Signed)
Pt arrived via EMS from [facility] with a complaint of hypoglycemia.  Pt has recently had his insulin medications adjusted with similar hypoglycemic  Episodes.  Pt does have dementia and is unable to relate symptoms to staff.  Nursing facility gave Ensure in and effort to raise glucose level.  Pt was given oral glucose and a half of bag of D10 by EMS.  EMS stated that pt blood glucose level was 56.

## 2013-09-04 NOTE — ED Notes (Signed)
Bed: [id number] Expected date:  Expected time:  Means of arrival:  Comments: EMS 79yo M, Hypoglycemia

## 2013-09-04 NOTE — ED Provider Notes (Signed)
CSN: [id number]     Arrival date & time 09/04/13  0053 History   First MD Initiated Contact with Patient 09/04/13 0059     Chief Complaint  Patient presents with  . Hypoglycemia   (Consider location/radiation/quality/duration/timing/severity/associated sxs/prior Treatment) HPI 79 year old male presents emergency apartment via EMS from his nursing facility with complaints of hypoglycemia.  Per report, patient recently had insulin, medications adjusted, and he has had problems with low blood sugar.  Patient has dementia, cannot give history or complaint to staff.  Initial blood glucose 56 her EMS.  Patient has received ensure, oral glucose, and one half bag of D10.  He is without complaints at this time.  Glucose is here 141 Past Medical History  Diagnosis Date  . Hypertension   . CAD (coronary artery disease)   . Hyperlipidemia   . DM (diabetes mellitus)   . Dementia   . Anemia   . Gout   . Prostate cancer    Past Surgical History  Procedure Laterality Date  . Finger surgery  1960  . Cataract extraction  2000   Family History  Problem Relation Age of Onset  . Heart disease Brother   . Diabetes Brother   . Hypertension Mother   . Colon cancer Neg Hx    History  Substance Use Topics  . Smoking status: Never Smoker   . Smokeless tobacco: Never Used  . Alcohol Use: No    Review of Systems  Unable to perform ROS: Dementia    Allergies  Review of patient's allergies indicates no known allergies.  Home Medications   Current Outpatient Rx  Name  Route  Sig  Dispense  Refill  . acetaminophen (MAPAP) 325 MG tablet   Oral   Take 650 mg by mouth every 6 (six) hours as needed for pain.          Marland Kitchen amLODipine (NORVASC) 2.5 MG tablet   Oral   Take 2.5 mg by mouth daily.           Marland Kitchen aspirin 81 MG chewable tablet   Oral   Chew 81 mg by mouth daily.         . calcium-vitamin D (OSCAL WITH D) 500-200 MG-UNIT per tablet   Oral   Take 1 tablet by mouth daily.          . carvedilol (COREG) 12.5 MG tablet   Oral   Take 12.5 mg by mouth 2 (two) times daily with a meal.           . ezetimibe-simvastatin (VYTORIN) 10-10 MG per tablet   Oral   Take 1 tablet by mouth at bedtime.           Marland Kitchen glipiZIDE (GLUCOTROL) 10 MG tablet   Oral   Take 10 mg by mouth 3 (three) times daily with meals.         . haloperidol (HALDOL) 0.5 MG tablet   Oral   Take 0.5 mg by mouth at bedtime.          . hydrochlorothiazide 25 MG tablet   Oral   Take 25 mg by mouth daily.           . insulin aspart protamine-insulin aspart (NOVOLOG 70/30) (70-30) 100 UNIT/ML injection   Subcutaneous   Inject 7 Units into the skin daily with lunch.         . levothyroxine (SYNTHROID, LEVOTHROID) 25 MCG tablet   Oral   Take 25 mcg by mouth daily before  breakfast.         . lisinopril (PRINIVIL,ZESTRIL) 40 MG tablet   Oral   Take 40 mg by mouth daily.           . sitaGLIPtin (JANUVIA) 100 MG tablet   Oral   Take 100 mg by mouth daily.         Marland Kitchen Specialty Vitamins Products (ICAPS LUTEIN-ZEAXANTHIN PO)   Oral   Take 1 tablet by mouth 2 (two) times daily.           . vitamin B-12 (CYANOCOBALAMIN) 1000 MCG tablet   Oral   Take 1,000 mcg by mouth daily.          BP 166/60  Pulse 59  Temp(Src) 97.1 F (36.2 C) (Oral)  Resp 18  SpO2 98% Physical Exam  Nursing note and vitals reviewed. Constitutional: He appears well-developed and well-nourished.  HENT:  Head: Normocephalic and atraumatic.  Nose: Nose normal.  Mouth/Throat: Oropharynx is clear and moist.  Eyes: Conjunctivae and EOM are normal. Pupils are equal, round, and reactive to light.  Neck: Normal range of motion. Neck supple. No JVD present. No tracheal deviation present. No thyromegaly present.  Cardiovascular: Normal rate, regular rhythm, normal heart sounds and intact distal pulses.  Exam reveals no gallop and no friction rub.   No murmur heard. Pulmonary/Chest: Effort normal and breath  sounds normal. No stridor. No respiratory distress. He has no wheezes. He has no rales. He exhibits no tenderness.  Abdominal: Soft. Bowel sounds are normal. He exhibits no distension and no mass. There is no tenderness. There is no rebound and no guarding.  Musculoskeletal: Normal range of motion. He exhibits no edema and no tenderness.  Lymphadenopathy:    He has no cervical adenopathy.  Neurological: He is alert. He exhibits normal muscle tone. Coordination normal.  Skin: Skin is warm and dry. No rash noted. No erythema. No pallor.    ED Course  Procedures (including critical care time) Labs Review Labs Reviewed - No data to display Imaging Review No results found.  EKG Interpretation   None       MDM   1. Hypoglycemia    79 year old male with hypoglycemia.  We'll recheck in one hour.  We'll offer food.   3:24 AM Pt has had three cbg checks an hour apart, 141, 217, 306.  Will not treat elevated bs given hypoglycemia earlier.  D/w pt's family who are comfortable with patient going home, to f/u with Dr Sabra Heck with [facility].  Kalman Drape, MD 09/04/13 0330

## 2013-09-04 NOTE — ED Notes (Signed)
CBG - 141 ° °

## 2013-09-06 LAB — GLUCOSE, CAPILLARY: Glucose-Capillary: 141 mg/dL — ABNORMAL HIGH (ref 70–99)

## 2013-09-28 DIAGNOSIS — C44320 Squamous cell carcinoma of skin of unspecified parts of face: Secondary | ICD-10-CM | POA: Diagnosis not present

## 2013-12-08 ENCOUNTER — Ambulatory Visit (HOSPITAL_COMMUNITY): Payer: Medicare Other

## 2013-12-08 ENCOUNTER — Emergency Department (HOSPITAL_COMMUNITY)
Admission: EM | Admit: 2013-12-08 | Discharge: 2013-12-08 | Disposition: A | Discharge status: Home / Self Care | Payer: Medicare Other | Attending: Emergency Medicine | Admitting: Emergency Medicine

## 2013-12-08 ENCOUNTER — Encounter (HOSPITAL_COMMUNITY): Payer: Self-pay | Admitting: Emergency Medicine

## 2013-12-08 DIAGNOSIS — Z8546 Personal history of malignant neoplasm of prostate: Secondary | ICD-10-CM | POA: Diagnosis not present

## 2013-12-08 DIAGNOSIS — F0390 Unspecified dementia without behavioral disturbance: Secondary | ICD-10-CM | POA: Insufficient documentation

## 2013-12-08 DIAGNOSIS — E119 Type 2 diabetes mellitus without complications: Secondary | ICD-10-CM | POA: Diagnosis not present

## 2013-12-08 DIAGNOSIS — S0083XA Contusion of other part of head, initial encounter: Secondary | ICD-10-CM | POA: Diagnosis not present

## 2013-12-08 DIAGNOSIS — IMO0002 Reserved for concepts with insufficient information to code with codable children: Secondary | ICD-10-CM | POA: Diagnosis not present

## 2013-12-08 DIAGNOSIS — T1490XA Injury, unspecified, initial encounter: Secondary | ICD-10-CM | POA: Diagnosis not present

## 2013-12-08 DIAGNOSIS — S0990XA Unspecified injury of head, initial encounter: Secondary | ICD-10-CM | POA: Diagnosis not present

## 2013-12-08 DIAGNOSIS — W010XXA Fall on same level from slipping, tripping and stumbling without subsequent striking against object, initial encounter: Secondary | ICD-10-CM | POA: Insufficient documentation

## 2013-12-08 DIAGNOSIS — W19XXXA Unspecified fall, initial encounter: Secondary | ICD-10-CM

## 2013-12-08 DIAGNOSIS — I10 Essential (primary) hypertension: Secondary | ICD-10-CM | POA: Diagnosis not present

## 2013-12-08 DIAGNOSIS — Z7982 Long term (current) use of aspirin: Secondary | ICD-10-CM | POA: Insufficient documentation

## 2013-12-08 DIAGNOSIS — Z794 Long term (current) use of insulin: Secondary | ICD-10-CM | POA: Insufficient documentation

## 2013-12-08 DIAGNOSIS — E785 Hyperlipidemia, unspecified: Secondary | ICD-10-CM | POA: Insufficient documentation

## 2013-12-08 DIAGNOSIS — S1093XA Contusion of unspecified part of neck, initial encounter: Secondary | ICD-10-CM | POA: Diagnosis not present

## 2013-12-08 DIAGNOSIS — D649 Anemia, unspecified: Secondary | ICD-10-CM | POA: Insufficient documentation

## 2013-12-08 DIAGNOSIS — I2510 Atherosclerotic heart disease of native coronary artery without angina pectoris: Secondary | ICD-10-CM | POA: Insufficient documentation

## 2013-12-08 DIAGNOSIS — S0993XA Unspecified injury of face, initial encounter: Secondary | ICD-10-CM | POA: Diagnosis not present

## 2013-12-08 DIAGNOSIS — S0003XA Contusion of scalp, initial encounter: Secondary | ICD-10-CM | POA: Insufficient documentation

## 2013-12-08 DIAGNOSIS — Y939 Activity, unspecified: Secondary | ICD-10-CM | POA: Insufficient documentation

## 2013-12-08 DIAGNOSIS — S199XXA Unspecified injury of neck, initial encounter: Secondary | ICD-10-CM | POA: Diagnosis not present

## 2013-12-08 DIAGNOSIS — Y9210 Unspecified residential institution as the place of occurrence of the external cause: Secondary | ICD-10-CM | POA: Insufficient documentation

## 2013-12-08 DIAGNOSIS — Z79899 Other long term (current) drug therapy: Secondary | ICD-10-CM | POA: Diagnosis not present

## 2013-12-08 DIAGNOSIS — R51 Headache: Secondary | ICD-10-CM | POA: Diagnosis not present

## 2013-12-08 LAB — URINE MICROSCOPIC-ADD ON

## 2013-12-08 LAB — URINALYSIS, ROUTINE W REFLEX MICROSCOPIC
BILIRUBIN URINE: NEGATIVE
GLUCOSE, UA: NEGATIVE mg/dL
Hgb urine dipstick: NEGATIVE
KETONES UR: NEGATIVE mg/dL
Leukocytes, UA: NEGATIVE
Nitrite: NEGATIVE
PH: 6.5 (ref 5.0–8.0)
Protein, ur: 30 mg/dL — AB
Specific Gravity, Urine: 1.019 (ref 1.005–1.030)
Urobilinogen, UA: 0.2 mg/dL (ref 0.0–1.0)

## 2013-12-08 LAB — CBG MONITORING, ED: Glucose-Capillary: 67 mg/dL — ABNORMAL LOW (ref 70–99)

## 2013-12-08 NOTE — ED Notes (Signed)
Pt incont sm. Amt brown stool, cleaned/changed, assisted to reposition.  Pt given OJ for CBG 67.  Tolerating well.  Pt placed to cardiac/02 monitor.  NAD.  Awaiting CT. NAD.

## 2013-12-08 NOTE — ED Notes (Signed)
Bed: WA06 Expected date:  Expected time:  Means of arrival:  Comments: fall

## 2013-12-08 NOTE — ED Provider Notes (Signed)
CSN: [id number]     Arrival date & time 12/08/13  1129 History   First MD Initiated Contact with Patient 12/08/13 1200     Chief Complaint  Patient presents with  . Fall  . Abrasion     (Consider location/radiation/quality/duration/timing/severity/associated sxs/prior Treatment) HPI Comments: 80 yo male with a mechanical fall.  Family states he hit his head.    Level V Caveat secondary to dementia.   Patient is a 80 y.o. male presenting with fall.  Fall This is a new problem. The current episode started less than 1 hour ago. Episode frequency: once. The problem has been resolved. Pertinent negatives include no chest pain, no abdominal pain, no headaches and no shortness of breath.    Past Medical History  Diagnosis Date  . Hypertension   . CAD (coronary artery disease)   . Hyperlipidemia   . DM (diabetes mellitus)   . Dementia   . Anemia   . Gout   . Prostate cancer    Past Surgical History  Procedure Laterality Date  . Finger surgery  1960  . Cataract extraction  2000   Family History  Problem Relation Age of Onset  . Heart disease Brother   . Diabetes Brother   . Hypertension Mother   . Colon cancer Neg Hx    History  Substance Use Topics  . Smoking status: Never Smoker   . Smokeless tobacco: Never Used  . Alcohol Use: No    Review of Systems  Unable to perform ROS: Dementia  Respiratory: Negative for shortness of breath.   Cardiovascular: Negative for chest pain.  Gastrointestinal: Negative for abdominal pain.  Neurological: Negative for headaches.      Allergies  Review of patient's allergies indicates no known allergies.  Home Medications   Current Outpatient Rx  Name  Route  Sig  Dispense  Refill  . amLODipine (NORVASC) 2.5 MG tablet   Oral   Take 2.5 mg by mouth every morning.         Marland Kitchen aspirin 81 MG chewable tablet   Oral   Chew 81 mg by mouth every morning.         . calcium-vitamin D (OSCAL WITH D) 500-200 MG-UNIT per tablet    Oral   Take 1 tablet by mouth every morning.          . carvedilol (COREG) 12.5 MG tablet   Oral   Take 12.5 mg by mouth 2 (two) times daily with a meal.           . Emollient (LUBRIDERM SERIOUSLY SENSITIVE) LOTN   Topical   Apply 1 application topically 2 (two) times daily.         Marland Kitchen ezetimibe-simvastatin (VYTORIN) 10-10 MG per tablet   Oral   Take 1 tablet by mouth at bedtime.           . haloperidol (HALDOL) 0.5 MG tablet   Oral   Take 0.5 mg by mouth at bedtime.          . hydrochlorothiazide (HYDRODIURIL) 25 MG tablet   Oral   Take 25 mg by mouth every morning.         . imiquimod (ALDARA) 5 % cream   Topical   Apply 1 application topically every morning. To left cheek         . insulin aspart protamine-insulin aspart (NOVOLOG 70/30) (70-30) 100 UNIT/ML injection   Subcutaneous   Inject 10 Units into the skin 2 (two) times  daily with a meal.          . levothyroxine (SYNTHROID, LEVOTHROID) 25 MCG tablet   Oral   Take 25 mcg by mouth daily before breakfast.         . loperamide (IMODIUM) 2 MG capsule   Oral   Take 2 mg by mouth daily as needed for diarrhea or loose stools.         . sitaGLIPtin (JANUVIA) 100 MG tablet   Oral   Take 100 mg by mouth every morning.          Marland Kitchen Specialty Vitamins Products (ICAPS LUTEIN-ZEAXANTHIN PO)   Oral   Take 1 tablet by mouth every morning.          . vitamin B-12 (CYANOCOBALAMIN) 1000 MCG tablet   Oral   Take 1,000 mcg by mouth every morning.           BP 112/53  Pulse 64  Temp(Src) 98.7 F (37.1 C) (Oral)  Resp 16  SpO2 96% Physical Exam  Nursing note and vitals reviewed. Constitutional: He appears well-developed and well-nourished. No distress.  HENT:  Head: Normocephalic and atraumatic. Head is without raccoon's eyes and without Battle's sign.  Nose: Nose normal.  Mouth/Throat: Oropharynx is clear and moist.  Eyes: Conjunctivae and EOM are normal. Pupils are equal, round, and reactive  to light. No scleral icterus.  Neck: Neck supple. No spinous process tenderness and no muscular tenderness present.  Cardiovascular: Normal rate, regular rhythm, normal heart sounds and intact distal pulses.   No murmur heard. Pulmonary/Chest: Effort normal and breath sounds normal. No stridor. No respiratory distress. He has no wheezes. He has no rales. He exhibits no tenderness.  Abdominal: Soft. He exhibits no distension. There is no tenderness. There is no rebound and no guarding.  Musculoskeletal: Normal range of motion. He exhibits no edema and no tenderness.       Thoracic back: He exhibits no tenderness and no bony tenderness.       Lumbar back: He exhibits no tenderness and no bony tenderness.  No evidence of trauma to extremities, except as noted.  2+ distal pulses.    Neurological: He is alert. He is disoriented.  Skin: Skin is warm and dry. No rash noted.  Psychiatric: He has a normal mood and affect. His behavior is normal.    ED Course  Procedures (including critical care time) Labs Review Labs Reviewed  URINALYSIS, ROUTINE W REFLEX MICROSCOPIC - Abnormal; Notable for the following:    Protein, ur 30 (*)    All other components within normal limits  CBG MONITORING, ED - Abnormal; Notable for the following:    Glucose-Capillary 67 (*)    All other components within normal limits  URINE MICROSCOPIC-ADD ON   Imaging Review Ct Head Wo Contrast  12/08/2013   CLINICAL DATA:  History dementia, status post fall this morning.  EXAM: CT HEAD WITHOUT CONTRAST  CT CERVICAL SPINE WITHOUT CONTRAST  TECHNIQUE: Multidetector CT imaging of the head and cervical spine was performed following the standard protocol without intravenous contrast. Multiplanar CT image reconstructions of the cervical spine were also generated.  COMPARISON:  None.  FINDINGS: CT HEAD FINDINGS  There is moderate diffuse cerebral and mild cerebellar atrophy with compensatory ventriculomegaly. There is stable  encephalomalacia in the right occipital lobe consistent with previous ischemic insult. There is no evidence of an acute intracranial hemorrhage or of an evolving ischemic infarction. An old lacunar infarction in the periphery of the  right basal ganglia is present. The cerebellum and brainstem exhibit normal density.  At bone window settings the observed portions of the paranasal sinuses and mastoid air cells are clear. There is no evidence of an acute skull fracture.  CT CERVICAL SPINE FINDINGS  There is mild loss of the normal cervical lordosis. The cervical vertebral bodies are preserved in height. There is disc space narrowing at C5-6 and at C6-7. The prevertebral soft tissue spaces appear normal. There is no evidence of a perched facet at nor spinous process fracture. The odontoid is intact and the lateral masses of C1 align normally with those of C2. The observed portions of the first and second ribs appear normal. The bony ring at each cervical level is intact. The pulmonary apices appear clear.  IMPRESSION: 1. There are extensive chronic changes within the brain, but there is no evidence of an acute intracranial hemorrhage nor of an acute ischemic or hemorrhagic infarction. 2. There is no evidence of an acute skull fracture. 3. There is no evidence of an acute cervical spine fracture nor dislocation. There is mild reversal of the normal cervical lordosis. Degenerative disc disease in the mid and lower cervical spine is present.   Electronically Signed   By: David  Martinique   On: 12/08/2013 14:02   Ct Cervical Spine Wo Contrast  12/08/2013   CLINICAL DATA:  History dementia, status post fall this morning.  EXAM: CT HEAD WITHOUT CONTRAST  CT CERVICAL SPINE WITHOUT CONTRAST  TECHNIQUE: Multidetector CT imaging of the head and cervical spine was performed following the standard protocol without intravenous contrast. Multiplanar CT image reconstructions of the cervical spine were also generated.  COMPARISON:   None.  FINDINGS: CT HEAD FINDINGS  There is moderate diffuse cerebral and mild cerebellar atrophy with compensatory ventriculomegaly. There is stable encephalomalacia in the right occipital lobe consistent with previous ischemic insult. There is no evidence of an acute intracranial hemorrhage or of an evolving ischemic infarction. An old lacunar infarction in the periphery of the right basal ganglia is present. The cerebellum and brainstem exhibit normal density.  At bone window settings the observed portions of the paranasal sinuses and mastoid air cells are clear. There is no evidence of an acute skull fracture.  CT CERVICAL SPINE FINDINGS  There is mild loss of the normal cervical lordosis. The cervical vertebral bodies are preserved in height. There is disc space narrowing at C5-6 and at C6-7. The prevertebral soft tissue spaces appear normal. There is no evidence of a perched facet at nor spinous process fracture. The odontoid is intact and the lateral masses of C1 align normally with those of C2. The observed portions of the first and second ribs appear normal. The bony ring at each cervical level is intact. The pulmonary apices appear clear.  IMPRESSION: 1. There are extensive chronic changes within the brain, but there is no evidence of an acute intracranial hemorrhage nor of an acute ischemic or hemorrhagic infarction. 2. There is no evidence of an acute skull fracture. 3. There is no evidence of an acute cervical spine fracture nor dislocation. There is mild reversal of the normal cervical lordosis. Degenerative disc disease in the mid and lower cervical spine is present.   Electronically Signed   By: David  Martinique   On: 12/08/2013 14:02  All radiology studies independently viewed by me.     EKG Interpretation   None       MDM   Final diagnoses:  Fall  Closed head  injury  Scalp contusion    80 yo male who fell and hit his head.  Family and nursing home report mechanical fall, tripping over  his feet.  Well appearing, but demented on exam.  CT head and C Spine negative.  UA negative.  Blood sugar slightly low after he missed lunch, he tolerated PO subsequently.  He ambulated without difficulty.  DCd back to facility.      Houston Siren III, MD 12/08/13 [phone]

## 2013-12-08 NOTE — ED Notes (Signed)
Initial Contact - pt resting on stretcher, family at bs.  Per family pt with reported fall this AM at facility.  Mild abrasion noted to forehead.  Pt difficult to assess 2/2 dementia, family reports pt is at baseline at this time.  Pt c/o "a little" pain to forehead.  PERRL.  Skin otherwise PWD.  MAEI.  NAD.  Awaiting EDP eval.

## 2013-12-08 NOTE — ED Notes (Signed)
Pt to CT

## 2013-12-08 NOTE — Discharge Instructions (Signed)
Facial or Scalp Contusion A facial or scalp contusion is a deep bruise on the face or head. Injuries to the face and head generally cause a lot of swelling, especially around the eyes. Contusions are the result of an injury that caused bleeding under the skin. The contusion may turn blue, purple, or yellow. Minor injuries will give you a painless contusion, but more severe contusions may stay painful and swollen for a few weeks.  CAUSES  A facial or scalp contusion is caused by a blunt injury or trauma to the face or head area.  SIGNS AND SYMPTOMS   Swelling of the injured area.   Discoloration of the injured area.   Tenderness, soreness, or pain in the injured area.  DIAGNOSIS  The diagnosis can be made by taking a medical history and doing a physical exam. An X-ray exam, CT scan, or MRI may be needed to determine if there are any associated injuries, such as broken bones (fractures). TREATMENT  Often, the best treatment for a facial or scalp contusion is applying cold compresses to the injured area. Over-the-counter medicines may also be recommended for pain control.  HOME CARE INSTRUCTIONS   Only take over-the-counter or prescription medicines as directed by your health care provider.   Apply ice to the injured area.   Put ice in a plastic bag.   Place a towel between your skin and the bag.   Leave the ice on for 20 minutes, 2 3 times a day.  SEEK MEDICAL CARE IF:  You have bite problems.   You have pain with chewing.   You are concerned about facial defects. SEEK IMMEDIATE MEDICAL CARE IF:  You have severe pain or a headache that is not relieved by medicine.   You have unusual sleepiness, confusion, or personality changes.   You throw up (vomit).   You have a persistent nosebleed.   You have double vision or blurred vision.   You have fluid drainage from your nose or ear.   You have difficulty walking or using your arms or legs.  MAKE SURE YOU:    Understand these instructions.  Will watch your condition.  Will get help right away if you are not doing well or get worse. Document Released: 11/07/2004 Document Revised: 07/21/2013 Document Reviewed: 05/13/2013 [facility] Patient Information 2014 [place], [place].  Fall Prevention and Home Safety Falls cause injuries and can affect all age groups. It is possible to use preventive measures to significantly decrease the likelihood of falls. There are many simple measures which can make your home safer and prevent falls. OUTDOORS  Repair cracks and edges of walkways and driveways.  Remove high doorway thresholds.  Trim shrubbery on the main path into your home.  Have good outside lighting.  Clear walkways of tools, rocks, debris, and clutter.  Check that handrails are not broken and are securely fastened. Both sides of steps should have handrails.  Have leaves, snow, and ice cleared regularly.  Use sand or salt on walkways during winter months.  In the garage, clean up grease or oil spills. BATHROOM  Install night lights.  Install grab bars by the toilet and in the tub and shower.  Use non-skid mats or decals in the tub or shower.  Place a plastic non-slip stool in the shower to sit on, if needed.  Keep floors dry and clean up all water on the floor immediately.  Remove soap buildup in the tub or shower on a regular basis.  Secure bath  mats with non-slip, double-sided rug tape.  Remove throw rugs and tripping hazards from the floors. BEDROOMS  Install night lights.  Make sure a bedside light is easy to reach.  Do not use oversized bedding.  Keep a telephone by your bedside.  Have a firm chair with side arms to use for getting dressed.  Remove throw rugs and tripping hazards from the floor. KITCHEN  Keep handles on pots and pans turned toward the center of the stove. Use back burners when possible.  Clean up spills quickly and allow time for  drying.  Avoid walking on wet floors.  Avoid hot utensils and knives.  Position shelves so they are not too high or low.  Place commonly used objects within easy reach.  If necessary, use a sturdy step stool with a grab bar when reaching.  Keep electrical cables out of the way.  Do not use floor polish or wax that makes floors slippery. If you must use wax, use non-skid floor wax.  Remove throw rugs and tripping hazards from the floor. STAIRWAYS  Never leave objects on stairs.  Place handrails on both sides of stairways and use them. Fix any loose handrails. Make sure handrails on both sides of the stairways are as long as the stairs.  Check carpeting to make sure it is firmly attached along stairs. Make repairs to worn or loose carpet promptly.  Avoid placing throw rugs at the top or bottom of stairways, or properly secure the rug with carpet tape to prevent slippage. Get rid of throw rugs, if possible.  Have an electrician put in a light switch at the top and bottom of the stairs. OTHER FALL PREVENTION TIPS  Wear low-heel or rubber-soled shoes that are supportive and fit well. Wear closed toe shoes.  When using a stepladder, make sure it is fully opened and both spreaders are firmly locked. Do not climb a closed stepladder.  Add color or contrast paint or tape to grab bars and handrails in your home. Place contrasting color strips on first and last steps.  Learn and use mobility aids as needed. Install an electrical emergency response system.  Turn on lights to avoid dark areas. Replace light bulbs that burn out immediately. Get light switches that glow.  Arrange furniture to create clear pathways. Keep furniture in the same place.  Firmly attach carpet with non-skid or double-sided tape.  Eliminate uneven floor surfaces.  Select a carpet pattern that does not visually hide the edge of steps.  Be aware of all pets. OTHER HOME SAFETY TIPS  Set the water temperature  for 120 F (48.8 C).  Keep emergency numbers on or near the telephone.  Keep smoke detectors on every level of the home and near sleeping areas. Document Released: 09/20/2002 Document Revised: 03/31/2012 Document Reviewed: 12/20/2011 [facility] Patient Information 2014 [place].

## 2013-12-08 NOTE — ED Notes (Addendum)
Pt from [place] with witnessed fall this AM, approx 1045.  Pt with abrasion to forehead and c/o minor L wrist pain.  No deformities per EMS.  No LOC.  A+ox4, hx dementia however at baseline.

## 2013-12-14 DIAGNOSIS — Z85828 Personal history of other malignant neoplasm of skin: Secondary | ICD-10-CM | POA: Diagnosis not present

## 2013-12-14 DIAGNOSIS — I872 Venous insufficiency (chronic) (peripheral): Secondary | ICD-10-CM | POA: Diagnosis not present

## 2013-12-14 DIAGNOSIS — D0439 Carcinoma in situ of skin of other parts of face: Secondary | ICD-10-CM | POA: Diagnosis not present

## 2013-12-14 DIAGNOSIS — D0430 Carcinoma in situ of skin of unspecified part of face: Secondary | ICD-10-CM | POA: Diagnosis not present

## 2014-02-25 DIAGNOSIS — F0390 Unspecified dementia without behavioral disturbance: Secondary | ICD-10-CM | POA: Diagnosis not present

## 2014-02-25 DIAGNOSIS — E039 Hypothyroidism, unspecified: Secondary | ICD-10-CM | POA: Diagnosis not present

## 2014-02-25 DIAGNOSIS — N183 Chronic kidney disease, stage 3 unspecified: Secondary | ICD-10-CM | POA: Diagnosis not present

## 2014-02-25 DIAGNOSIS — E782 Mixed hyperlipidemia: Secondary | ICD-10-CM | POA: Diagnosis not present

## 2014-02-25 DIAGNOSIS — I10 Essential (primary) hypertension: Secondary | ICD-10-CM | POA: Diagnosis not present

## 2014-02-25 DIAGNOSIS — I2510 Atherosclerotic heart disease of native coronary artery without angina pectoris: Secondary | ICD-10-CM | POA: Diagnosis not present

## 2014-02-25 DIAGNOSIS — E1129 Type 2 diabetes mellitus with other diabetic kidney complication: Secondary | ICD-10-CM | POA: Diagnosis not present

## 2014-05-30 DIAGNOSIS — E039 Hypothyroidism, unspecified: Secondary | ICD-10-CM | POA: Diagnosis not present

## 2014-06-15 DIAGNOSIS — Z961 Presence of intraocular lens: Secondary | ICD-10-CM | POA: Diagnosis not present

## 2014-06-15 DIAGNOSIS — E119 Type 2 diabetes mellitus without complications: Secondary | ICD-10-CM | POA: Diagnosis not present

## 2014-06-15 DIAGNOSIS — H353190 Nonexudative age-related macular degeneration, unspecified eye, stage unspecified: Secondary | ICD-10-CM | POA: Diagnosis not present

## 2014-07-14 DIAGNOSIS — Z23 Encounter for immunization: Secondary | ICD-10-CM | POA: Diagnosis not present

## 2014-08-29 DIAGNOSIS — I10 Essential (primary) hypertension: Secondary | ICD-10-CM | POA: Diagnosis not present

## 2014-08-29 DIAGNOSIS — E039 Hypothyroidism, unspecified: Secondary | ICD-10-CM | POA: Diagnosis not present

## 2014-08-29 DIAGNOSIS — E1122 Type 2 diabetes mellitus with diabetic chronic kidney disease: Secondary | ICD-10-CM | POA: Diagnosis not present

## 2014-08-29 DIAGNOSIS — Z794 Long term (current) use of insulin: Secondary | ICD-10-CM | POA: Diagnosis not present

## 2014-08-29 DIAGNOSIS — M109 Gout, unspecified: Secondary | ICD-10-CM | POA: Diagnosis not present

## 2014-08-29 DIAGNOSIS — I2510 Atherosclerotic heart disease of native coronary artery without angina pectoris: Secondary | ICD-10-CM | POA: Diagnosis not present

## 2014-08-29 DIAGNOSIS — F0390 Unspecified dementia without behavioral disturbance: Secondary | ICD-10-CM | POA: Diagnosis not present

## 2014-08-29 DIAGNOSIS — Z8546 Personal history of malignant neoplasm of prostate: Secondary | ICD-10-CM | POA: Diagnosis not present

## 2014-08-29 DIAGNOSIS — D649 Anemia, unspecified: Secondary | ICD-10-CM | POA: Diagnosis not present

## 2015-03-07 DIAGNOSIS — B351 Tinea unguium: Secondary | ICD-10-CM | POA: Diagnosis not present

## 2015-03-10 DIAGNOSIS — I10 Essential (primary) hypertension: Secondary | ICD-10-CM | POA: Diagnosis not present

## 2015-03-10 DIAGNOSIS — I2510 Atherosclerotic heart disease of native coronary artery without angina pectoris: Secondary | ICD-10-CM | POA: Diagnosis not present

## 2015-03-10 DIAGNOSIS — D649 Anemia, unspecified: Secondary | ICD-10-CM | POA: Diagnosis not present

## 2015-03-10 DIAGNOSIS — E782 Mixed hyperlipidemia: Secondary | ICD-10-CM | POA: Diagnosis not present

## 2015-03-10 DIAGNOSIS — E039 Hypothyroidism, unspecified: Secondary | ICD-10-CM | POA: Diagnosis not present

## 2015-03-10 DIAGNOSIS — E1122 Type 2 diabetes mellitus with diabetic chronic kidney disease: Secondary | ICD-10-CM | POA: Diagnosis not present

## 2015-03-10 DIAGNOSIS — Z794 Long term (current) use of insulin: Secondary | ICD-10-CM | POA: Diagnosis not present

## 2015-03-10 DIAGNOSIS — N183 Chronic kidney disease, stage 3 (moderate): Secondary | ICD-10-CM | POA: Diagnosis not present

## 2015-03-10 DIAGNOSIS — Z8546 Personal history of malignant neoplasm of prostate: Secondary | ICD-10-CM | POA: Diagnosis not present

## 2015-06-06 DIAGNOSIS — B351 Tinea unguium: Secondary | ICD-10-CM | POA: Diagnosis not present

## 2015-07-06 DIAGNOSIS — L8990 Pressure ulcer of unspecified site, unspecified stage: Secondary | ICD-10-CM | POA: Diagnosis not present

## 2015-07-08 DIAGNOSIS — I2510 Atherosclerotic heart disease of native coronary artery without angina pectoris: Secondary | ICD-10-CM | POA: Diagnosis not present

## 2015-07-08 DIAGNOSIS — S3130XD Unspecified open wound of scrotum and testes, subsequent encounter: Secondary | ICD-10-CM | POA: Diagnosis not present

## 2015-07-08 DIAGNOSIS — I129 Hypertensive chronic kidney disease with stage 1 through stage 4 chronic kidney disease, or unspecified chronic kidney disease: Secondary | ICD-10-CM | POA: Diagnosis not present

## 2015-07-08 DIAGNOSIS — F0390 Unspecified dementia without behavioral disturbance: Secondary | ICD-10-CM | POA: Diagnosis not present

## 2015-07-08 DIAGNOSIS — E119 Type 2 diabetes mellitus without complications: Secondary | ICD-10-CM | POA: Diagnosis not present

## 2015-07-08 DIAGNOSIS — N189 Chronic kidney disease, unspecified: Secondary | ICD-10-CM | POA: Diagnosis not present

## 2015-07-11 DIAGNOSIS — E119 Type 2 diabetes mellitus without complications: Secondary | ICD-10-CM | POA: Diagnosis not present

## 2015-07-11 DIAGNOSIS — N189 Chronic kidney disease, unspecified: Secondary | ICD-10-CM | POA: Diagnosis not present

## 2015-07-11 DIAGNOSIS — I2510 Atherosclerotic heart disease of native coronary artery without angina pectoris: Secondary | ICD-10-CM | POA: Diagnosis not present

## 2015-07-11 DIAGNOSIS — S3130XD Unspecified open wound of scrotum and testes, subsequent encounter: Secondary | ICD-10-CM | POA: Diagnosis not present

## 2015-07-11 DIAGNOSIS — F0390 Unspecified dementia without behavioral disturbance: Secondary | ICD-10-CM | POA: Diagnosis not present

## 2015-07-11 DIAGNOSIS — I129 Hypertensive chronic kidney disease with stage 1 through stage 4 chronic kidney disease, or unspecified chronic kidney disease: Secondary | ICD-10-CM | POA: Diagnosis not present

## 2015-07-14 DIAGNOSIS — I129 Hypertensive chronic kidney disease with stage 1 through stage 4 chronic kidney disease, or unspecified chronic kidney disease: Secondary | ICD-10-CM | POA: Diagnosis not present

## 2015-07-14 DIAGNOSIS — E119 Type 2 diabetes mellitus without complications: Secondary | ICD-10-CM | POA: Diagnosis not present

## 2015-07-14 DIAGNOSIS — N189 Chronic kidney disease, unspecified: Secondary | ICD-10-CM | POA: Diagnosis not present

## 2015-07-14 DIAGNOSIS — S3130XD Unspecified open wound of scrotum and testes, subsequent encounter: Secondary | ICD-10-CM | POA: Diagnosis not present

## 2015-07-14 DIAGNOSIS — I2510 Atherosclerotic heart disease of native coronary artery without angina pectoris: Secondary | ICD-10-CM | POA: Diagnosis not present

## 2015-07-14 DIAGNOSIS — F0390 Unspecified dementia without behavioral disturbance: Secondary | ICD-10-CM | POA: Diagnosis not present

## 2015-07-18 DIAGNOSIS — I2510 Atherosclerotic heart disease of native coronary artery without angina pectoris: Secondary | ICD-10-CM | POA: Diagnosis not present

## 2015-07-18 DIAGNOSIS — N189 Chronic kidney disease, unspecified: Secondary | ICD-10-CM | POA: Diagnosis not present

## 2015-07-18 DIAGNOSIS — F0390 Unspecified dementia without behavioral disturbance: Secondary | ICD-10-CM | POA: Diagnosis not present

## 2015-07-18 DIAGNOSIS — E119 Type 2 diabetes mellitus without complications: Secondary | ICD-10-CM | POA: Diagnosis not present

## 2015-07-18 DIAGNOSIS — S3130XD Unspecified open wound of scrotum and testes, subsequent encounter: Secondary | ICD-10-CM | POA: Diagnosis not present

## 2015-07-18 DIAGNOSIS — I129 Hypertensive chronic kidney disease with stage 1 through stage 4 chronic kidney disease, or unspecified chronic kidney disease: Secondary | ICD-10-CM | POA: Diagnosis not present

## 2015-07-21 DIAGNOSIS — N189 Chronic kidney disease, unspecified: Secondary | ICD-10-CM | POA: Diagnosis not present

## 2015-07-21 DIAGNOSIS — E119 Type 2 diabetes mellitus without complications: Secondary | ICD-10-CM | POA: Diagnosis not present

## 2015-07-21 DIAGNOSIS — I129 Hypertensive chronic kidney disease with stage 1 through stage 4 chronic kidney disease, or unspecified chronic kidney disease: Secondary | ICD-10-CM | POA: Diagnosis not present

## 2015-07-21 DIAGNOSIS — S3130XD Unspecified open wound of scrotum and testes, subsequent encounter: Secondary | ICD-10-CM | POA: Diagnosis not present

## 2015-07-21 DIAGNOSIS — I2510 Atherosclerotic heart disease of native coronary artery without angina pectoris: Secondary | ICD-10-CM | POA: Diagnosis not present

## 2015-07-21 DIAGNOSIS — F0390 Unspecified dementia without behavioral disturbance: Secondary | ICD-10-CM | POA: Diagnosis not present

## 2015-07-25 DIAGNOSIS — N189 Chronic kidney disease, unspecified: Secondary | ICD-10-CM | POA: Diagnosis not present

## 2015-07-25 DIAGNOSIS — F0390 Unspecified dementia without behavioral disturbance: Secondary | ICD-10-CM | POA: Diagnosis not present

## 2015-07-25 DIAGNOSIS — S3130XD Unspecified open wound of scrotum and testes, subsequent encounter: Secondary | ICD-10-CM | POA: Diagnosis not present

## 2015-07-25 DIAGNOSIS — I129 Hypertensive chronic kidney disease with stage 1 through stage 4 chronic kidney disease, or unspecified chronic kidney disease: Secondary | ICD-10-CM | POA: Diagnosis not present

## 2015-07-25 DIAGNOSIS — I2510 Atherosclerotic heart disease of native coronary artery without angina pectoris: Secondary | ICD-10-CM | POA: Diagnosis not present

## 2015-07-25 DIAGNOSIS — E119 Type 2 diabetes mellitus without complications: Secondary | ICD-10-CM | POA: Diagnosis not present

## 2015-07-27 DIAGNOSIS — I129 Hypertensive chronic kidney disease with stage 1 through stage 4 chronic kidney disease, or unspecified chronic kidney disease: Secondary | ICD-10-CM | POA: Diagnosis not present

## 2015-07-27 DIAGNOSIS — S3130XD Unspecified open wound of scrotum and testes, subsequent encounter: Secondary | ICD-10-CM | POA: Diagnosis not present

## 2015-07-27 DIAGNOSIS — N189 Chronic kidney disease, unspecified: Secondary | ICD-10-CM | POA: Diagnosis not present

## 2015-07-27 DIAGNOSIS — I2510 Atherosclerotic heart disease of native coronary artery without angina pectoris: Secondary | ICD-10-CM | POA: Diagnosis not present

## 2015-07-27 DIAGNOSIS — F0390 Unspecified dementia without behavioral disturbance: Secondary | ICD-10-CM | POA: Diagnosis not present

## 2015-07-27 DIAGNOSIS — E119 Type 2 diabetes mellitus without complications: Secondary | ICD-10-CM | POA: Diagnosis not present

## 2015-07-30 DIAGNOSIS — Z23 Encounter for immunization: Secondary | ICD-10-CM | POA: Diagnosis not present

## 2015-08-02 DIAGNOSIS — F0390 Unspecified dementia without behavioral disturbance: Secondary | ICD-10-CM | POA: Diagnosis not present

## 2015-08-02 DIAGNOSIS — S3130XD Unspecified open wound of scrotum and testes, subsequent encounter: Secondary | ICD-10-CM | POA: Diagnosis not present

## 2015-08-02 DIAGNOSIS — I129 Hypertensive chronic kidney disease with stage 1 through stage 4 chronic kidney disease, or unspecified chronic kidney disease: Secondary | ICD-10-CM | POA: Diagnosis not present

## 2015-08-02 DIAGNOSIS — N189 Chronic kidney disease, unspecified: Secondary | ICD-10-CM | POA: Diagnosis not present

## 2015-08-02 DIAGNOSIS — E119 Type 2 diabetes mellitus without complications: Secondary | ICD-10-CM | POA: Diagnosis not present

## 2015-08-02 DIAGNOSIS — I2510 Atherosclerotic heart disease of native coronary artery without angina pectoris: Secondary | ICD-10-CM | POA: Diagnosis not present

## 2015-09-06 DIAGNOSIS — B351 Tinea unguium: Secondary | ICD-10-CM | POA: Diagnosis not present

## 2015-09-13 DIAGNOSIS — E782 Mixed hyperlipidemia: Secondary | ICD-10-CM | POA: Diagnosis not present

## 2015-09-13 DIAGNOSIS — E1122 Type 2 diabetes mellitus with diabetic chronic kidney disease: Secondary | ICD-10-CM | POA: Diagnosis not present

## 2015-09-13 DIAGNOSIS — N183 Chronic kidney disease, stage 3 (moderate): Secondary | ICD-10-CM | POA: Diagnosis not present

## 2015-09-13 DIAGNOSIS — Z7984 Long term (current) use of oral hypoglycemic drugs: Secondary | ICD-10-CM | POA: Diagnosis not present

## 2015-09-13 DIAGNOSIS — Z794 Long term (current) use of insulin: Secondary | ICD-10-CM | POA: Diagnosis not present

## 2015-09-13 DIAGNOSIS — E039 Hypothyroidism, unspecified: Secondary | ICD-10-CM | POA: Diagnosis not present

## 2015-09-13 DIAGNOSIS — F0390 Unspecified dementia without behavioral disturbance: Secondary | ICD-10-CM | POA: Diagnosis not present

## 2015-11-24 DIAGNOSIS — R21 Rash and other nonspecific skin eruption: Secondary | ICD-10-CM | POA: Diagnosis not present

## 2015-12-18 ENCOUNTER — Emergency Department (HOSPITAL_COMMUNITY)
Admission: EM | Admit: 2015-12-18 | Discharge: 2015-12-18 | Disposition: A | Discharge status: Home / Self Care | Payer: Medicare Other | Attending: Emergency Medicine | Admitting: Emergency Medicine

## 2015-12-18 DIAGNOSIS — E11649 Type 2 diabetes mellitus with hypoglycemia without coma: Secondary | ICD-10-CM | POA: Diagnosis not present

## 2015-12-18 DIAGNOSIS — Z8739 Personal history of other diseases of the musculoskeletal system and connective tissue: Secondary | ICD-10-CM | POA: Diagnosis not present

## 2015-12-18 DIAGNOSIS — Z794 Long term (current) use of insulin: Secondary | ICD-10-CM | POA: Diagnosis not present

## 2015-12-18 DIAGNOSIS — R7309 Other abnormal glucose: Secondary | ICD-10-CM | POA: Diagnosis not present

## 2015-12-18 DIAGNOSIS — E785 Hyperlipidemia, unspecified: Secondary | ICD-10-CM | POA: Insufficient documentation

## 2015-12-18 DIAGNOSIS — Z862 Personal history of diseases of the blood and blood-forming organs and certain disorders involving the immune mechanism: Secondary | ICD-10-CM | POA: Diagnosis not present

## 2015-12-18 DIAGNOSIS — Z8546 Personal history of malignant neoplasm of prostate: Secondary | ICD-10-CM | POA: Diagnosis not present

## 2015-12-18 DIAGNOSIS — Z7984 Long term (current) use of oral hypoglycemic drugs: Secondary | ICD-10-CM | POA: Diagnosis not present

## 2015-12-18 DIAGNOSIS — I10 Essential (primary) hypertension: Secondary | ICD-10-CM | POA: Diagnosis not present

## 2015-12-18 DIAGNOSIS — F0390 Unspecified dementia without behavioral disturbance: Secondary | ICD-10-CM | POA: Insufficient documentation

## 2015-12-18 DIAGNOSIS — I2510 Atherosclerotic heart disease of native coronary artery without angina pectoris: Secondary | ICD-10-CM | POA: Insufficient documentation

## 2015-12-18 DIAGNOSIS — Z7982 Long term (current) use of aspirin: Secondary | ICD-10-CM | POA: Diagnosis not present

## 2015-12-18 DIAGNOSIS — E161 Other hypoglycemia: Secondary | ICD-10-CM | POA: Diagnosis not present

## 2015-12-18 DIAGNOSIS — E162 Hypoglycemia, unspecified: Secondary | ICD-10-CM

## 2015-12-18 DIAGNOSIS — Z79899 Other long term (current) drug therapy: Secondary | ICD-10-CM | POA: Insufficient documentation

## 2015-12-18 LAB — BASIC METABOLIC PANEL
Anion gap: 8 (ref 5–15)
BUN: 33 mg/dL — AB (ref 6–20)
CHLORIDE: 104 mmol/L (ref 101–111)
CO2: 25 mmol/L (ref 22–32)
CREATININE: 1.54 mg/dL — AB (ref 0.61–1.24)
Calcium: 8.8 mg/dL — ABNORMAL LOW (ref 8.9–10.3)
GFR calc Af Amer: 43 mL/min — ABNORMAL LOW (ref >60)
GFR, EST NON AFRICAN AMERICAN: 37 mL/min — AB (ref >60)
Glucose, Bld: 134 mg/dL — ABNORMAL HIGH (ref 65–99)
Potassium: 3.8 mmol/L (ref 3.5–5.1)
Sodium: 137 mmol/L (ref 135–145)

## 2015-12-18 LAB — CBC WITH DIFFERENTIAL/PLATELET
Basophils Absolute: 0 10*3/uL (ref 0.0–0.1)
Basophils Relative: 0 %
EOS ABS: 0 10*3/uL (ref 0.0–0.7)
EOS PCT: 1 %
HCT: 33.4 % — ABNORMAL LOW (ref 39.0–52.0)
Hemoglobin: 10.6 g/dL — ABNORMAL LOW (ref 13.0–17.0)
LYMPHS ABS: 0.7 10*3/uL (ref 0.7–4.0)
Lymphocytes Relative: 12 %
MCH: 29 pg (ref 26.0–34.0)
MCHC: 31.7 g/dL (ref 30.0–36.0)
MCV: 91.3 fL (ref 78.0–100.0)
MONOS PCT: 6 %
Monocytes Absolute: 0.4 10*3/uL (ref 0.1–1.0)
Neutro Abs: 5.2 10*3/uL (ref 1.7–7.7)
Neutrophils Relative %: 81 %
PLATELETS: 144 10*3/uL — AB (ref 150–400)
RBC: 3.66 MIL/uL — ABNORMAL LOW (ref 4.22–5.81)
RDW: 15.1 % (ref 11.5–15.5)
WBC: 6.3 10*3/uL (ref 4.0–10.5)

## 2015-12-18 LAB — CBG MONITORING, ED
GLUCOSE-CAPILLARY: 133 mg/dL — AB (ref 65–99)
Glucose-Capillary: 119 mg/dL — ABNORMAL HIGH (ref 65–99)

## 2015-12-18 LAB — URINALYSIS, ROUTINE W REFLEX MICROSCOPIC
Bilirubin Urine: NEGATIVE
GLUCOSE, UA: NEGATIVE mg/dL
HGB URINE DIPSTICK: NEGATIVE
Ketones, ur: NEGATIVE mg/dL
Leukocytes, UA: NEGATIVE
Nitrite: NEGATIVE
Protein, ur: 100 mg/dL — AB
SPECIFIC GRAVITY, URINE: 1.022 (ref 1.005–1.030)
pH: 6.5 (ref 5.0–8.0)

## 2015-12-18 LAB — URINE MICROSCOPIC-ADD ON

## 2015-12-18 NOTE — ED Notes (Signed)
After d/c, staff found Pt's DNR in room.  VM left on daughter's home phone.  Daughter listed as "Responsible Party."

## 2015-12-18 NOTE — ED Notes (Signed)
Labs obtained from existing IV line.  Patient yelling out but can answer yes and know.  Patient is alert but unsure as of orientation.

## 2015-12-18 NOTE — ED Provider Notes (Signed)
CSN: [id number]     Arrival date & time 12/18/15  [id number] History   First MD Initiated Contact with Patient 12/18/15 1001     Chief Complaint  Patient presents with  . Hypoglycemia     (Consider location/radiation/quality/duration/timing/severity/associated sxs/prior Treatment) HPI Comments: LEVEL 5 CAVEAT FOR DEMENTIA.  PT's daughter at bedside. Reports that she got a call from the nursing home about her father being unresponsive with a blood sugar in the 40s. When she arrived, he was a bit sluggish, but eating food. EMS reported that CBG was in the 50s for them, d50 was given to the patient by them. Pt arrives to the ER asleep, arousable, alert. Daughter reports that pt is at baseline. She is unsure if there was any fevers at nursing home. No hx of similar episode in the past.   Patient is a 82 y.o. male presenting with hypoglycemia. The history is provided by medical records.  Hypoglycemia   Past Medical History  Diagnosis Date  . Hypertension   . CAD (coronary artery disease)   . Hyperlipidemia   . DM (diabetes mellitus)   . Dementia   . Anemia   . Gout   . Prostate cancer    Past Surgical History  Procedure Laterality Date  . Finger surgery  1960  . Cataract extraction  2000   Family History  Problem Relation Age of Onset  . Heart disease Brother   . Diabetes Brother   . Hypertension Mother   . Colon cancer Neg Hx    Social History  Substance Use Topics  . Smoking status: Never Smoker   . Smokeless tobacco: Never Used  . Alcohol Use: No    Review of Systems    Allergies  Review of patient's allergies indicates no known allergies.  Home Medications   Prior to Admission medications   Medication Sig Start Date End Date Taking? Authorizing Provider  amLODipine (NORVASC) 2.5 MG tablet Take 2.5 mg by mouth every morning.   Yes Historical Provider, MD  aspirin 81 MG chewable tablet Chew 81 mg by mouth every morning.   Yes Historical Provider, MD  Calcium  Carbonate-Vitamin D (CALCIUM 600+D) 600-400 MG-UNIT tablet Take 1 tablet by mouth 2 (two) times daily.   Yes Historical Provider, MD  carvedilol (COREG) 12.5 MG tablet Take 12.5 mg by mouth 2 (two) times daily.    Yes Historical Provider, MD  ezetimibe-simvastatin (VYTORIN) 10-10 MG per tablet Take 1 tablet by mouth at bedtime.     Yes Historical Provider, MD  guaifenesin (ROBITUSSIN) 100 MG/5ML syrup Take 200 mg by mouth every 6 (six) hours as needed for cough or congestion.    Yes Historical Provider, MD  haloperidol (HALDOL) 0.5 MG tablet Take 0.25 mg by mouth at bedtime.    Yes Historical Provider, MD  hydrochlorothiazide (HYDRODIURIL) 25 MG tablet Take 25 mg by mouth every morning.   Yes Historical Provider, MD  Hypromellose (NATURAL BALANCE TEARS) 0.4 % SOLN Place 1-2 drops into both eyes 2 (two) times daily as needed (for redness/dry eyes).   Yes Historical Provider, MD  Insulin Lispro Prot & Lispro (HUMALOG 75/25 MIX) (75-25) 100 UNIT/ML Kwikpen Inject 14-25 Units into the skin 2 (two) times daily. Takes 25 units in the morning and 14 units in the evening   Yes Historical Provider, MD  levothyroxine (SYNTHROID, LEVOTHROID) 88 MCG tablet Take 88 mcg by mouth daily before breakfast.   Yes Historical Provider, MD  loratadine (CLARITIN) 10 MG tablet Take 10  mg by mouth at bedtime.    Yes Historical Provider, MD  sitaGLIPtin (JANUVIA) 100 MG tablet Take 100 mg by mouth every morning.    Yes Historical Provider, MD  Specialty Vitamins Products (ICAPS LUTEIN-ZEAXANTHIN PO) Take 2 tablets by mouth every morning.    Yes Historical Provider, MD  vitamin B-12 (CYANOCOBALAMIN) 1000 MCG tablet Take 1,000 mcg by mouth every morning.    Yes Historical Provider, MD   BP 147/77 mmHg  Pulse 72  Temp(Src)   Resp 19  SpO2 90% Physical Exam  Constitutional: He is oriented to person, place, and time. He appears well-developed.  HENT:  Head: Atraumatic.  Neck: Neck supple.  Cardiovascular: Normal rate.    Pulmonary/Chest: Effort normal. No respiratory distress.  Abdominal: Soft. He exhibits no distension. There is no tenderness.  Neurological: He is alert and oriented to person, place, and time.  Skin: Skin is warm.  Nursing note and vitals reviewed.   ED Course  Procedures (including critical care time) Labs Review Labs Reviewed  CBC WITH DIFFERENTIAL/PLATELET - Abnormal; Notable for the following:    RBC 3.66 (*)    Hemoglobin 10.6 (*)    HCT 33.4 (*)    Platelets 144 (*)    All other components within normal limits  BASIC METABOLIC PANEL - Abnormal; Notable for the following:    Glucose, Bld 134 (*)    BUN 33 (*)    Creatinine, Ser 1.54 (*)    Calcium 8.8 (*)    GFR calc non Af Amer 37 (*)    GFR calc Af Amer 43 (*)    All other components within normal limits  URINALYSIS, ROUTINE W REFLEX MICROSCOPIC (NOT AT [facility]) - Abnormal; Notable for the following:    Protein, ur 100 (*)    All other components within normal limits  URINE MICROSCOPIC-ADD ON - Abnormal; Notable for the following:    Squamous Epithelial / LPF 0-5 (*)    Bacteria, UA RARE (*)    All other components within normal limits  CBG MONITORING, ED - Abnormal; Notable for the following:    Glucose-Capillary 119 (*)    All other components within normal limits  CBG MONITORING, ED - Abnormal; Notable for the following:    Glucose-Capillary 133 (*)    All other components within normal limits  URINE CULTURE    Imaging Review No results found. I have personally reviewed and evaluated these images and lab results as part of my medical decision-making.   EKG Interpretation None      MDM   Final diagnoses:  Hypoglycemia    Pt comes in with cc of low blood glucose. He is on insulin, januvia. CBG normal here. Exam not focal for a source of infection. WE will get basic labs and UA. WE will get 3 different CBG, starting now. If pt remains stable, we will d.c him back.   Varney Biles, MD 12/18/15  1343

## 2015-12-18 NOTE — Discharge Instructions (Signed)
[name] had normal sugars in the ER over the course of 3 hours. We recommend q 6 hours check of the blood glucose today and tomorrow -and then back to regular checks. Ensure he is eating his meals. All the ER labs, including urine test are normal.   Hypoglycemia Hypoglycemia occurs when the glucose in your blood is too low. Glucose is a type of sugar that is your body's main energy source. Hormones, such as insulin and glucagon, control the level of glucose in the blood. Insulin lowers blood glucose and glucagon increases blood glucose. Having too much insulin in your blood stream, or not eating enough food containing sugar, can result in hypoglycemia. Hypoglycemia can happen to people with or without diabetes. It can develop quickly and can be a medical emergency.  CAUSES   Missing or delaying meals.  Not eating enough carbohydrates at meals.  Taking too much diabetes medicine.  Not timing your oral diabetes medicine or insulin doses with meals, snacks, and exercise.  Nausea and vomiting.  Certain medicines.  Severe illnesses, such as hepatitis, kidney disorders, and certain eating disorders.  Increased activity or exercise without eating something extra or adjusting medicines.  Drinking too much alcohol.  A nerve disorder that affects body functions like your heart rate, blood pressure, and digestion (autonomic neuropathy).  A condition where the stomach muscles do not function properly (gastroparesis). Therefore, medicines and food may not absorb properly.  Rarely, a tumor of the pancreas can produce too much insulin. SYMPTOMS   Hunger.  Sweating (diaphoresis).  Change in body temperature.  Shakiness.  Headache.  Anxiety.  Lightheadedness.  Irritability.  Difficulty concentrating.  Dry mouth.  Tingling or numbness in the hands or feet.  Restless sleep or sleep disturbances.  Altered speech and coordination.  Change in mental status.  Seizures or  prolonged convulsions.  Combativeness.  Drowsiness (lethargic).  Weakness.  Increased heart rate or palpitations.  Confusion.  Pale, gray skin color.  Blurred or double vision.  Fainting. DIAGNOSIS  A physical exam and medical history will be performed. Your caregiver may make a diagnosis based on your symptoms. Blood tests and other lab tests may be performed to confirm a diagnosis. Once the diagnosis is made, your caregiver will see if your signs and symptoms go away once your blood glucose is raised.  TREATMENT  Usually, you can easily treat your hypoglycemia when you notice symptoms.  Check your blood glucose. If it is less than 70 mg/dl, take one of the following:   3-4 glucose tablets.    cup juice.    cup regular soda.   1 cup skim milk.   -1 tube of glucose gel.   5-6 hard candies.   Avoid high-fat drinks or food that may delay a rise in blood glucose levels.  Do not take more than the recommended amount of sugary foods, drinks, gel, or tablets. Doing so will cause your blood glucose to go too high.   Wait 10-15 minutes and recheck your blood glucose. If it is still less than 70 mg/dl or below your target range, repeat treatment.   Eat a snack if it is more than 1 hour until your next meal.  There may be a time when your blood glucose may go so low that you are unable to treat yourself at home when you start to notice symptoms. You may need someone to help you. You may even faint or be unable to swallow. If you cannot treat yourself, someone  will need to bring you to the hospital.  [place]  If you have diabetes, follow your diabetes management plan by:  Taking your medicines as directed.  Following your exercise plan.  Following your meal plan. Do not skip meals. Eat on time.  Testing your blood glucose regularly. Check your blood glucose before and after exercise. If you exercise longer or different than usual, be sure to  check blood glucose more frequently.  Wearing your medical alert jewelry that says you have diabetes.  Identify the cause of your hypoglycemia. Then, develop ways to prevent the recurrence of hypoglycemia.  Do not take a hot bath or shower right after an insulin shot.  Always carry treatment with you. Glucose tablets are the easiest to carry.  If you are going to drink alcohol, drink it only with meals.  Tell friends or family members ways to keep you safe during a seizure. This may include removing hard or sharp objects from the area or turning you on your side.  Maintain a healthy weight. SEEK MEDICAL CARE IF:   You are having problems keeping your blood glucose in your target range.  You are having frequent episodes of hypoglycemia.  You feel you might be having side effects from your medicines.  You are not sure why your blood glucose is dropping so low.  You notice a change in vision or a new problem with your vision. SEEK IMMEDIATE MEDICAL CARE IF:   Confusion develops.  A change in mental status occurs.  The inability to swallow develops.  Fainting occurs.   This information is not intended to replace advice given to you by your health care provider. Make sure you discuss any questions you have with your health care provider.   Document Released: 09/30/2005 Document Revised: 10/05/2013 Document Reviewed: 06/06/2015 Elsevier Interactive Patient Education 2016 [employer]  Hypoglycemia Hypoglycemia occurs when the glucose in your blood is too low. Glucose is a type of sugar that is your body's main energy source. Hormones, such as insulin and glucagon, control the level of glucose in the blood. Insulin lowers blood glucose and glucagon increases blood glucose. Having too much insulin in your blood stream, or not eating enough food containing sugar, can result in hypoglycemia. Hypoglycemia can happen to people with or without diabetes. It can develop quickly and can  be a medical emergency.  CAUSES   Missing or delaying meals.  Not eating enough carbohydrates at meals.  Taking too much diabetes medicine.  Not timing your oral diabetes medicine or insulin doses with meals, snacks, and exercise.  Nausea and vomiting.  Certain medicines.  Severe illnesses, such as hepatitis, kidney disorders, and certain eating disorders.  Increased activity or exercise without eating something extra or adjusting medicines.  Drinking too much alcohol.  A nerve disorder that affects body functions like your heart rate, blood pressure, and digestion (autonomic neuropathy).  A condition where the stomach muscles do not function properly (gastroparesis). Therefore, medicines and food may not absorb properly.  Rarely, a tumor of the pancreas can produce too much insulin. SYMPTOMS   Hunger.  Sweating (diaphoresis).  Change in body temperature.  Shakiness.  Headache.  Anxiety.  Lightheadedness.  Irritability.  Difficulty concentrating.  Dry mouth.  Tingling or numbness in the hands or feet.  Restless sleep or sleep disturbances.  Altered speech and coordination.  Change in mental status.  Seizures or prolonged convulsions.  Combativeness.  Drowsiness (lethargic).  Weakness.  Increased heart rate or palpitations.  Confusion.  Pale, gray skin color.  Blurred or double vision.  Fainting. DIAGNOSIS  A physical exam and medical history will be performed. Your caregiver may make a diagnosis based on your symptoms. Blood tests and other lab tests may be performed to confirm a diagnosis. Once the diagnosis is made, your caregiver will see if your signs and symptoms go away once your blood glucose is raised.  TREATMENT  Usually, you can easily treat your hypoglycemia when you notice symptoms.  Check your blood glucose. If it is less than 70 mg/dl, take one of the following:   3-4 glucose tablets.    cup juice.    cup regular  soda.   1 cup skim milk.   -1 tube of glucose gel.   5-6 hard candies.   Avoid high-fat drinks or food that may delay a rise in blood glucose levels.  Do not take more than the recommended amount of sugary foods, drinks, gel, or tablets. Doing so will cause your blood glucose to go too high.   Wait 10-15 minutes and recheck your blood glucose. If it is still less than 70 mg/dl or below your target range, repeat treatment.   Eat a snack if it is more than 1 hour until your next meal.  There may be a time when your blood glucose may go so low that you are unable to treat yourself at home when you start to notice symptoms. You may need someone to help you. You may even faint or be unable to swallow. If you cannot treat yourself, someone will need to bring you to the hospital.  [name]  If you have diabetes, follow your diabetes management plan by:  Taking your medicines as directed.  Following your exercise plan.  Following your meal plan. Do not skip meals. Eat on time.  Testing your blood glucose regularly. Check your blood glucose before and after exercise. If you exercise longer or different than usual, be sure to check blood glucose more frequently.  Wearing your medical alert jewelry that says you have diabetes.  Identify the cause of your hypoglycemia. Then, develop ways to prevent the recurrence of hypoglycemia.  Do not take a hot bath or shower right after an insulin shot.  Always carry treatment with you. Glucose tablets are the easiest to carry.  If you are going to drink alcohol, drink it only with meals.  Tell friends or family members ways to keep you safe during a seizure. This may include removing hard or sharp objects from the area or turning you on your side.  Maintain a healthy weight. SEEK MEDICAL CARE IF:   You are having problems keeping your blood glucose in your target range.  You are having frequent episodes of  hypoglycemia.  You feel you might be having side effects from your medicines.  You are not sure why your blood glucose is dropping so low.  You notice a change in vision or a new problem with your vision. SEEK IMMEDIATE MEDICAL CARE IF:   Confusion develops.  A change in mental status occurs.  The inability to swallow develops.  Fainting occurs.   This information is not intended to replace advice given to you by your health care provider. Make sure you discuss any questions you have with your health care provider.   Document Released: 09/30/2005 Document Revised: 10/05/2013 Document Reviewed: 06/06/2015 Elsevier Interactive Patient Education [employer].

## 2015-12-18 NOTE — ED Notes (Addendum)
Per [facility] from [facility], per staff pt had hypoglycemia 56 by EMS. D50 via IV by EMS. Pt was alert at the scene  yet disoriented to norms , HX dementia. Hx diabetes. Rechecked CBG by ems , 259.

## 2015-12-19 LAB — URINE CULTURE: Culture: NO GROWTH

## 2016-02-27 DIAGNOSIS — J988 Other specified respiratory disorders: Secondary | ICD-10-CM | POA: Diagnosis not present

## 2016-02-27 DIAGNOSIS — R05 Cough: Secondary | ICD-10-CM | POA: Diagnosis not present

## 2016-02-29 ENCOUNTER — Encounter (HOSPITAL_COMMUNITY): Payer: Self-pay

## 2016-02-29 ENCOUNTER — Emergency Department (HOSPITAL_COMMUNITY): Payer: Medicare Other

## 2016-02-29 ENCOUNTER — Emergency Department (HOSPITAL_COMMUNITY)
Admission: EM | Admit: 2016-02-29 | Discharge: 2016-02-29 | Disposition: A | Discharge status: Home / Self Care | Payer: Medicare Other | Attending: Emergency Medicine | Admitting: Emergency Medicine

## 2016-02-29 DIAGNOSIS — E1165 Type 2 diabetes mellitus with hyperglycemia: Secondary | ICD-10-CM | POA: Insufficient documentation

## 2016-02-29 DIAGNOSIS — Z79899 Other long term (current) drug therapy: Secondary | ICD-10-CM | POA: Insufficient documentation

## 2016-02-29 DIAGNOSIS — E785 Hyperlipidemia, unspecified: Secondary | ICD-10-CM | POA: Insufficient documentation

## 2016-02-29 DIAGNOSIS — Z8546 Personal history of malignant neoplasm of prostate: Secondary | ICD-10-CM | POA: Insufficient documentation

## 2016-02-29 DIAGNOSIS — N189 Chronic kidney disease, unspecified: Secondary | ICD-10-CM | POA: Diagnosis not present

## 2016-02-29 DIAGNOSIS — Z7982 Long term (current) use of aspirin: Secondary | ICD-10-CM | POA: Diagnosis not present

## 2016-02-29 DIAGNOSIS — I129 Hypertensive chronic kidney disease with stage 1 through stage 4 chronic kidney disease, or unspecified chronic kidney disease: Secondary | ICD-10-CM | POA: Insufficient documentation

## 2016-02-29 DIAGNOSIS — R739 Hyperglycemia, unspecified: Secondary | ICD-10-CM

## 2016-02-29 DIAGNOSIS — R05 Cough: Secondary | ICD-10-CM

## 2016-02-29 DIAGNOSIS — R069 Unspecified abnormalities of breathing: Secondary | ICD-10-CM | POA: Diagnosis not present

## 2016-02-29 DIAGNOSIS — N179 Acute kidney failure, unspecified: Secondary | ICD-10-CM

## 2016-02-29 DIAGNOSIS — Z7984 Long term (current) use of oral hypoglycemic drugs: Secondary | ICD-10-CM | POA: Insufficient documentation

## 2016-02-29 DIAGNOSIS — Z794 Long term (current) use of insulin: Secondary | ICD-10-CM | POA: Insufficient documentation

## 2016-02-29 DIAGNOSIS — R7309 Other abnormal glucose: Secondary | ICD-10-CM | POA: Diagnosis not present

## 2016-02-29 DIAGNOSIS — R059 Cough, unspecified: Secondary | ICD-10-CM

## 2016-02-29 DIAGNOSIS — I2510 Atherosclerotic heart disease of native coronary artery without angina pectoris: Secondary | ICD-10-CM | POA: Diagnosis not present

## 2016-02-29 LAB — BASIC METABOLIC PANEL
ANION GAP: 7 (ref 5–15)
BUN: 51 mg/dL — ABNORMAL HIGH (ref 6–20)
CALCIUM: 8.5 mg/dL — AB (ref 8.9–10.3)
CHLORIDE: 103 mmol/L (ref 101–111)
CO2: 27 mmol/L (ref 22–32)
CREATININE: 2.06 mg/dL — AB (ref 0.61–1.24)
GFR calc non Af Amer: 26 mL/min — ABNORMAL LOW (ref >60)
GFR, EST AFRICAN AMERICAN: 30 mL/min — AB (ref >60)
Glucose, Bld: 340 mg/dL — ABNORMAL HIGH (ref 65–99)
Potassium: 3.6 mmol/L (ref 3.5–5.1)
SODIUM: 137 mmol/L (ref 135–145)

## 2016-02-29 LAB — CBC
HCT: 30.2 % — ABNORMAL LOW (ref 39.0–52.0)
HEMOGLOBIN: 9.7 g/dL — AB (ref 13.0–17.0)
MCH: 28.8 pg (ref 26.0–34.0)
MCHC: 32.1 g/dL (ref 30.0–36.0)
MCV: 89.6 fL (ref 78.0–100.0)
PLATELETS: 158 10*3/uL (ref 150–400)
RBC: 3.37 MIL/uL — AB (ref 4.22–5.81)
RDW: 15.9 % — ABNORMAL HIGH (ref 11.5–15.5)
WBC: 6.6 10*3/uL (ref 4.0–10.5)

## 2016-02-29 LAB — CBG MONITORING, ED
GLUCOSE-CAPILLARY: 351 mg/dL — AB (ref 65–99)
GLUCOSE-CAPILLARY: 253 mg/dL — AB (ref 65–99)

## 2016-02-29 LAB — BRAIN NATRIURETIC PEPTIDE: B Natriuretic Peptide: 222.1 pg/mL — ABNORMAL HIGH (ref 0.0–100.0)

## 2016-02-29 MED ORDER — SODIUM CHLORIDE 0.9 % IV BOLUS (SEPSIS)
500.0000 mL | Freq: Once | INTRAVENOUS | Status: AC
  Administered 2016-02-29: 500 mL via INTRAVENOUS

## 2016-02-29 NOTE — ED Notes (Addendum)
According to EMS, pts [facility] staff notified them of a BSG of 468. Staff administered insulin. EMS arrived to pts BSG 367. Pts daughter c/o worsening cough exhibited by pt. Pt has CXR completed and normal on 02/27/16. EMS administered 10mg  Albuterol, 0.5 Atrovent, and 125mg  Solu-Medrol IV. Pt arrives alert.

## 2016-02-29 NOTE — ED Provider Notes (Signed)
CSN: [id number]     Arrival date & time 02/29/16  1806 History   First MD Initiated Contact with Patient 02/29/16 1924     Chief Complaint  Patient presents with  . Hyperglycemia  . Cough   LEVEL 5 CAVEAT - DEMENTIA  (Consider location/radiation/quality/duration/timing/severity/associated sxs/prior Treatment) HPI  82 year old male with a history of dementia and diabetes presents from his living facility with a glucose of 468. Staff gave him subcutaneous take his insulin. Patient is been having a cough for 3 days. Had a chest x-ray 2 days ago that was normal per the daughter at the bedside. Daughter feels like the cough is worse today and so she has brought him in here for evaluation. He is currently on an antibiotic. Patient has chronic bilateral leg swelling that is not worse than typical. Patient currently has no complaints although he is demented.  Past Medical History  Diagnosis Date  . Hypertension   . CAD (coronary artery disease)   . Hyperlipidemia   . DM (diabetes mellitus) ([place])   . Dementia   . Anemia   . Gout   . Prostate cancer [facility])    Past Surgical History  Procedure Laterality Date  . Finger surgery  1960  . Cataract extraction  2000   Family History  Problem Relation Age of Onset  . Heart disease Brother   . Diabetes Brother   . Hypertension Mother   . Colon cancer Neg Hx    Social History  Substance Use Topics  . Smoking status: Never Smoker   . Smokeless tobacco: Never Used  . Alcohol Use: No    Review of Systems  Unable to perform ROS: Dementia      Allergies  Review of patient's allergies indicates no known allergies.  Home Medications   Prior to Admission medications   Medication Sig Start Date End Date Taking? Authorizing Provider  amLODipine (NORVASC) 2.5 MG tablet Take 2.5 mg by mouth every morning.    Historical Provider, MD  aspirin 81 MG chewable tablet Chew 81 mg by mouth every morning.    Historical Provider, MD  Calcium  Carbonate-Vitamin D (CALCIUM 600+D) 600-400 MG-UNIT tablet Take 1 tablet by mouth 2 (two) times daily.    Historical Provider, MD  carvedilol (COREG) 12.5 MG tablet Take 12.5 mg by mouth 2 (two) times daily.     Historical Provider, MD  ezetimibe-simvastatin (VYTORIN) 10-10 MG per tablet Take 1 tablet by mouth at bedtime.      Historical Provider, MD  guaifenesin (ROBITUSSIN) 100 MG/5ML syrup Take 200 mg by mouth every 6 (six) hours as needed for cough or congestion.     Historical Provider, MD  haloperidol (HALDOL) 0.5 MG tablet Take 0.25 mg by mouth at bedtime.     Historical Provider, MD  hydrochlorothiazide (HYDRODIURIL) 25 MG tablet Take 25 mg by mouth every morning.    Historical Provider, MD  Hypromellose (NATURAL BALANCE TEARS) 0.4 % SOLN Place 1-2 drops into both eyes 2 (two) times daily as needed (for redness/dry eyes).    Historical Provider, MD  Insulin Lispro Prot & Lispro (HUMALOG 75/25 MIX) (75-25) 100 UNIT/ML Kwikpen Inject 14-25 Units into the skin 2 (two) times daily. Takes 25 units in the morning and 14 units in the evening    Historical Provider, MD  levothyroxine (SYNTHROID, LEVOTHROID) 88 MCG tablet Take 88 mcg by mouth daily before breakfast.    Historical Provider, MD  loratadine (CLARITIN) 10 MG tablet Take 10 mg by mouth  at bedtime.     Historical Provider, MD  sitaGLIPtin (JANUVIA) 100 MG tablet Take 100 mg by mouth every morning.     Historical Provider, MD  Specialty Vitamins Products (ICAPS LUTEIN-ZEAXANTHIN PO) Take 2 tablets by mouth every morning.     Historical Provider, MD  vitamin B-12 (CYANOCOBALAMIN) 1000 MCG tablet Take 1,000 mcg by mouth every morning.     Historical Provider, MD   BP 163/72 mmHg  Pulse 79  Resp 20  SpO2 100% Physical Exam  Constitutional: He appears well-developed and well-nourished.  HENT:  Head: Normocephalic and atraumatic.  Right Ear: External ear normal.  Left Ear: External ear normal.  Nose: Nose normal.  Eyes: Right eye  exhibits no discharge. Left eye exhibits no discharge.  Neck: Neck supple.  Cardiovascular: Normal rate, regular rhythm, normal heart sounds and intact distal pulses.   Pulmonary/Chest: Effort normal and breath sounds normal.  Coarse breath sounds bilaterally. Difficult exam because patient does not want [place] to sit him up and pushes back against [place]. No increased WOB  Abdominal: Soft. There is no tenderness.  Musculoskeletal: He exhibits edema (trace pedal edema bilaterally).  Neurological: He is alert. He is disoriented.  Skin: Skin is warm and dry.  Nursing note and vitals reviewed.   ED Course  Procedures (including critical care time) Labs Review Labs Reviewed  BASIC METABOLIC PANEL - Abnormal; Notable for the following:    Glucose, Bld 340 (*)    BUN 51 (*)    Creatinine, Ser 2.06 (*)    Calcium 8.5 (*)    GFR calc non Af Amer 26 (*)    GFR calc Af Amer 30 (*)    All other components within normal limits  CBC - Abnormal; Notable for the following:    RBC 3.37 (*)    Hemoglobin 9.7 (*)    HCT 30.2 (*)    RDW 15.9 (*)    All other components within normal limits  BRAIN NATRIURETIC PEPTIDE - Abnormal; Notable for the following:    B Natriuretic Peptide 222.1 (*)    All other components within normal limits  CBG MONITORING, ED - Abnormal; Notable for the following:    Glucose-Capillary 351 (*)    All other components within normal limits  CBG MONITORING, ED - Abnormal; Notable for the following:    Glucose-Capillary 253 (*)    All other components within normal limits    Imaging Review Dg Chest 2 View  02/29/2016  CLINICAL DATA:  Cough for 2 days. EXAM: CHEST  2 VIEW COMPARISON:  01/19/2013 FINDINGS: The heart size and mediastinal contours are within normal limits. Both lungs are clear. The visualized skeletal structures are unremarkable. IMPRESSION: No active cardiopulmonary disease. Electronically Signed   By: Kerby Moors M.D.   On: 02/29/2016 19:27   I have  personally reviewed and evaluated these images and lab results as part of my medical decision-making.   EKG Interpretation None      MDM   Final diagnoses:  Cough  Hyperglycemia  Acute-on-chronic kidney injury ([place])    Patient is currently being treated with antibiotics but likely has a viral cause of cough. No signs of significant fluid overload causing cough. Is hyperglycemic but the insulin his NH gave seems to be effective as now his glucose is 253. Given gentle IV fluids for mild increase in Cr (1.5 to 2). Appears to be acute on chronic. Discussed with daughter, will d/c given he has f/u with PCP tomorrow and  is eating and drinking normally. Appears stable for discharge back to facility.    Sherwood Gambler, MD 03/01/16 [phone]

## 2016-02-29 NOTE — ED Notes (Signed)
Bed: [id number] Expected date:  Expected time:  Means of arrival:  Comments: EMS- 82yo M, cough x 3-4 days/hyperglycemia/CBG 369

## 2016-03-01 DIAGNOSIS — J069 Acute upper respiratory infection, unspecified: Secondary | ICD-10-CM | POA: Diagnosis not present

## 2016-04-08 DIAGNOSIS — Z794 Long term (current) use of insulin: Secondary | ICD-10-CM | POA: Diagnosis not present

## 2016-04-08 DIAGNOSIS — E1165 Type 2 diabetes mellitus with hyperglycemia: Secondary | ICD-10-CM | POA: Diagnosis not present

## 2016-04-08 DIAGNOSIS — R739 Hyperglycemia, unspecified: Secondary | ICD-10-CM | POA: Diagnosis not present

## 2016-04-08 DIAGNOSIS — Z7984 Long term (current) use of oral hypoglycemic drugs: Secondary | ICD-10-CM | POA: Diagnosis not present

## 2016-04-10 DIAGNOSIS — E1122 Type 2 diabetes mellitus with diabetic chronic kidney disease: Secondary | ICD-10-CM | POA: Diagnosis not present

## 2016-04-10 DIAGNOSIS — Z7984 Long term (current) use of oral hypoglycemic drugs: Secondary | ICD-10-CM | POA: Diagnosis not present

## 2016-04-10 DIAGNOSIS — Z794 Long term (current) use of insulin: Secondary | ICD-10-CM | POA: Diagnosis not present

## 2016-05-07 DIAGNOSIS — Z794 Long term (current) use of insulin: Secondary | ICD-10-CM | POA: Diagnosis not present

## 2016-05-07 DIAGNOSIS — E782 Mixed hyperlipidemia: Secondary | ICD-10-CM | POA: Diagnosis not present

## 2016-05-07 DIAGNOSIS — E1122 Type 2 diabetes mellitus with diabetic chronic kidney disease: Secondary | ICD-10-CM | POA: Diagnosis not present

## 2016-05-07 DIAGNOSIS — N183 Chronic kidney disease, stage 3 (moderate): Secondary | ICD-10-CM | POA: Diagnosis not present

## 2016-05-07 DIAGNOSIS — E039 Hypothyroidism, unspecified: Secondary | ICD-10-CM | POA: Diagnosis not present

## 2016-05-07 DIAGNOSIS — D649 Anemia, unspecified: Secondary | ICD-10-CM | POA: Diagnosis not present

## 2016-05-07 DIAGNOSIS — Z7984 Long term (current) use of oral hypoglycemic drugs: Secondary | ICD-10-CM | POA: Diagnosis not present

## 2016-06-21 DIAGNOSIS — R32 Unspecified urinary incontinence: Secondary | ICD-10-CM | POA: Diagnosis not present

## 2016-06-21 DIAGNOSIS — Z7409 Other reduced mobility: Secondary | ICD-10-CM | POA: Diagnosis not present

## 2016-06-21 DIAGNOSIS — R159 Full incontinence of feces: Secondary | ICD-10-CM | POA: Diagnosis not present

## 2016-06-21 DIAGNOSIS — R2689 Other abnormalities of gait and mobility: Secondary | ICD-10-CM | POA: Diagnosis not present

## 2016-07-19 DIAGNOSIS — Z23 Encounter for immunization: Secondary | ICD-10-CM | POA: Diagnosis not present

## 2016-07-26 DIAGNOSIS — K921 Melena: Secondary | ICD-10-CM | POA: Diagnosis not present

## 2016-10-20 ENCOUNTER — Emergency Department (HOSPITAL_COMMUNITY): Payer: Medicare Other

## 2016-10-20 ENCOUNTER — Emergency Department (HOSPITAL_COMMUNITY)
Admission: EM | Admit: 2016-10-20 | Discharge: 2016-10-21 | Disposition: A | Discharge status: Home / Self Care | Payer: Medicare Other | Attending: Emergency Medicine | Admitting: Emergency Medicine

## 2016-10-20 DIAGNOSIS — Z7982 Long term (current) use of aspirin: Secondary | ICD-10-CM | POA: Insufficient documentation

## 2016-10-20 DIAGNOSIS — E119 Type 2 diabetes mellitus without complications: Secondary | ICD-10-CM | POA: Diagnosis not present

## 2016-10-20 DIAGNOSIS — Z7984 Long term (current) use of oral hypoglycemic drugs: Secondary | ICD-10-CM | POA: Diagnosis not present

## 2016-10-20 DIAGNOSIS — R4182 Altered mental status, unspecified: Secondary | ICD-10-CM | POA: Diagnosis not present

## 2016-10-20 DIAGNOSIS — Z8546 Personal history of malignant neoplasm of prostate: Secondary | ICD-10-CM | POA: Diagnosis not present

## 2016-10-20 DIAGNOSIS — I10 Essential (primary) hypertension: Secondary | ICD-10-CM | POA: Diagnosis not present

## 2016-10-20 DIAGNOSIS — R1110 Vomiting, unspecified: Secondary | ICD-10-CM

## 2016-10-20 DIAGNOSIS — R531 Weakness: Secondary | ICD-10-CM | POA: Diagnosis not present

## 2016-10-20 DIAGNOSIS — R1111 Vomiting without nausea: Secondary | ICD-10-CM | POA: Diagnosis not present

## 2016-10-20 DIAGNOSIS — F0390 Unspecified dementia without behavioral disturbance: Secondary | ICD-10-CM | POA: Diagnosis not present

## 2016-10-20 DIAGNOSIS — R031 Nonspecific low blood-pressure reading: Secondary | ICD-10-CM | POA: Diagnosis not present

## 2016-10-20 DIAGNOSIS — I2510 Atherosclerotic heart disease of native coronary artery without angina pectoris: Secondary | ICD-10-CM | POA: Insufficient documentation

## 2016-10-20 LAB — URINALYSIS, MICROSCOPIC (REFLEX)
Bacteria, UA: NONE SEEN
SQUAMOUS EPITHELIAL / LPF: NONE SEEN

## 2016-10-20 LAB — CBC WITH DIFFERENTIAL/PLATELET
Basophils Absolute: 0 10*3/uL (ref 0.0–0.1)
Basophils Relative: 0 %
EOS ABS: 0.2 10*3/uL (ref 0.0–0.7)
Eosinophils Relative: 2 %
HCT: 31 % — ABNORMAL LOW (ref 39.0–52.0)
Hemoglobin: 10.1 g/dL — ABNORMAL LOW (ref 13.0–17.0)
LYMPHS ABS: 1 10*3/uL (ref 0.7–4.0)
Lymphocytes Relative: 15 %
MCH: 29.3 pg (ref 26.0–34.0)
MCHC: 32.6 g/dL (ref 30.0–36.0)
MCV: 89.9 fL (ref 78.0–100.0)
MONOS PCT: 7 %
Monocytes Absolute: 0.5 10*3/uL (ref 0.1–1.0)
NEUTROS PCT: 76 %
Neutro Abs: 5 10*3/uL (ref 1.7–7.7)
Platelets: 161 10*3/uL (ref 150–400)
RBC: 3.45 MIL/uL — ABNORMAL LOW (ref 4.22–5.81)
RDW: 15.9 % — ABNORMAL HIGH (ref 11.5–15.5)
WBC: 6.6 10*3/uL (ref 4.0–10.5)

## 2016-10-20 LAB — I-STAT TROPONIN, ED: TROPONIN I, POC: 0.01 ng/mL (ref 0.00–0.08)

## 2016-10-20 LAB — URINALYSIS, ROUTINE W REFLEX MICROSCOPIC
BILIRUBIN URINE: NEGATIVE
Glucose, UA: NEGATIVE mg/dL
HGB URINE DIPSTICK: NEGATIVE
Ketones, ur: NEGATIVE mg/dL
Leukocytes, UA: NEGATIVE
Nitrite: NEGATIVE
PH: 6.5 (ref 5.0–8.0)
Protein, ur: 30 mg/dL — AB
SPECIFIC GRAVITY, URINE: 1.015 (ref 1.005–1.030)

## 2016-10-20 LAB — COMPREHENSIVE METABOLIC PANEL
ALK PHOS: 67 U/L (ref 38–126)
ALT: 12 U/L — ABNORMAL LOW (ref 17–63)
ANION GAP: 8 (ref 5–15)
AST: 18 U/L (ref 15–41)
Albumin: 3.9 g/dL (ref 3.5–5.0)
BILIRUBIN TOTAL: 0.2 mg/dL — AB (ref 0.3–1.2)
BUN: 51 mg/dL — AB (ref 6–20)
CO2: 28 mmol/L (ref 22–32)
CREATININE: 1.67 mg/dL — AB (ref 0.61–1.24)
Calcium: 8.7 mg/dL — ABNORMAL LOW (ref 8.9–10.3)
Chloride: 99 mmol/L — ABNORMAL LOW (ref 101–111)
GFR calc Af Amer: 39 mL/min — ABNORMAL LOW (ref >60)
GFR calc non Af Amer: 33 mL/min — ABNORMAL LOW (ref >60)
Glucose, Bld: 127 mg/dL — ABNORMAL HIGH (ref 65–99)
POTASSIUM: 3.9 mmol/L (ref 3.5–5.1)
Sodium: 135 mmol/L (ref 135–145)
Total Protein: 7 g/dL (ref 6.5–8.1)

## 2016-10-20 LAB — CBG MONITORING, ED: GLUCOSE-CAPILLARY: 136 mg/dL — AB (ref 65–99)

## 2016-10-20 NOTE — ED Provider Notes (Signed)
[facility] DEPT Provider Note   CSN: [id number] Arrival date & time: 10/20/16  1814     History   Chief Complaint No chief complaint on file.   HPI [name] is a 83 y.o. male.  The history is provided by the patient. No language interpreter was used.    [name] is a 83 y.o. male who presents to the Emergency Department complaining of vomiting.  Level V caveat due to dementia.  History is provided by the patient's daughter. He was last seen normal about 445. She was visiting for dinner, which she does once a week. When the nursing facility got him up to dinner he had increased difficulty with walking required 2 person assistance and seemed to lean to the left. He typically only requires 1 person assistance. While at dinner he had some broth and immediately began vomiting. He was brought back to his room and attempted to eat again and had recurrent vomiting. He does not complain and has had no complaints lately. No reports of fevers or cough.  Past Medical History:  Diagnosis Date  . Anemia   . CAD (coronary artery disease)   . Dementia   . DM (diabetes mellitus) ([place])   . Gout   . Hyperlipidemia   . Hypertension   . Prostate cancer [facility])     Patient Active Problem List   Diagnosis Date Noted  . DEMENTIA 12/17/2010  . FULL INCONTINENCE OF FECES 12/17/2010  . DIARRHEA 12/17/2010  . CAD, NATIVE VESSEL 11/20/2010  . HYPERTENSION 11/19/2010    Past Surgical History:  Procedure Laterality Date  . CATARACT EXTRACTION  2000  . [place] Medications    Prior to Admission medications   Medication Sig Start Date End Date Taking? Authorizing Provider  acetaminophen (TYLENOL) 500 MG tablet Take 500 mg by mouth every 8 (eight) hours as needed for moderate pain.   Yes Historical Provider, MD  amLODipine (NORVASC) 2.5 MG tablet Take 2.5 mg by mouth every morning.   Yes Historical Provider, MD  aspirin 81 MG chewable tablet Chew 81 mg by mouth every  morning.   Yes Historical Provider, MD  Calcium Carbonate-Vitamin D (CALCIUM 600+D) 600-400 MG-UNIT tablet Take 1 tablet by mouth 2 (two) times daily.   Yes Historical Provider, MD  ezetimibe-simvastatin (VYTORIN) 10-10 MG per tablet Take 1 tablet by mouth at bedtime.     Yes Historical Provider, MD  haloperidol (HALDOL) 0.5 MG tablet Take 0.5 mg by mouth at bedtime.    Yes Historical Provider, MD  haloperidol (HALDOL) 1 MG tablet Take 1 mg by mouth every morning.   Yes Historical Provider, MD  hydrochlorothiazide (HYDRODIURIL) 25 MG tablet Take 25 mg by mouth every morning.   Yes Historical Provider, MD  Hypromellose (NATURAL BALANCE TEARS) 0.4 % SOLN Place 1-2 drops into both eyes 2 (two) times daily as needed (for redness/dry eyes).   Yes Historical Provider, MD  Insulin Lispro Prot & Lispro (HUMALOG 75/25 MIX) (75-25) 100 UNIT/ML Kwikpen Inject 16 Units into the skin 2 (two) times daily.    Yes Historical Provider, MD  ipratropium (ATROVENT) 0.06 % nasal spray Place 2 sprays into both nostrils 2 (two) times daily.   Yes Historical Provider, MD  levothyroxine (SYNTHROID, LEVOTHROID) 88 MCG tablet Take 88 mcg by mouth daily before breakfast.   Yes Historical Provider, MD  Multiple Vitamins-Minerals (ICAPS AREDS 2 PO) Take 2 capsules by mouth daily.   Yes  Historical Provider, MD  sitaGLIPtin (JANUVIA) 100 MG tablet Take 100 mg by mouth every morning.    Yes Historical Provider, MD  vitamin B-12 (CYANOCOBALAMIN) 1000 MCG tablet Take 1,000 mcg by mouth every morning.    Yes Historical Provider, MD  carvedilol (COREG) 12.5 MG tablet Take 12.5 mg by mouth 2 (two) times daily.     Historical Provider, MD  guaiFENesin (DIABETIC TUSSIN EX) 100 MG/5ML liquid Take 200 mg by mouth 4 (four) times daily as needed for cough.    Historical Provider, MD    Family History Family History  Problem Relation Age of Onset  . Heart disease Brother   . Diabetes Brother   . Hypertension Mother   . Colon cancer Neg  Hx     Social History Social History  Substance Use Topics  . Smoking status: Never Smoker  . Smokeless tobacco: Never Used  . Alcohol use No     Allergies   Patient has no known allergies.   Review of Systems Review of Systems  Unable to perform ROS: Dementia     Physical Exam Updated Vital Signs BP 190/72 (BP Location: Right Arm) Comment: Simultaneous filing. User may not have seen previous data.  Pulse (!) 59 Comment: Simultaneous filing. User may not have seen previous data.  Temp 98.7 F (37.1 C) (Oral)   Resp 12 Comment: Simultaneous filing. User may not have seen previous data.  Ht 5\' 9"  (1.753 m)   Wt 170 lb (77.1 kg)   SpO2 98% Comment: Simultaneous filing. User may not have seen previous data.  BMI 25.10 kg/m   Physical Exam  Constitutional: He appears well-developed and well-nourished.  Chronically ill-appearing  HENT:  Head: Normocephalic and atraumatic.  Cardiovascular: Normal rate and regular rhythm.   No murmur heard. Pulmonary/Chest: Effort normal. No respiratory distress.  Occasional rhonchi  Abdominal: Soft. There is no tenderness. There is no rebound and no guarding.  Musculoskeletal: He exhibits no tenderness.  1+ pitting edema to bilateral lower extremities  Neurological:  Drowsy but arouses to verbal stimuli. Profound generalized weakness and difficulty following commands. Minimally verbal.  Skin: Skin is warm and dry. There is pallor.  Psychiatric: He has a normal mood and affect. His behavior is normal.  Nursing note and vitals reviewed.    ED Treatments / Results  Labs (all labs ordered are listed, but only abnormal results are displayed) Labs Reviewed  COMPREHENSIVE METABOLIC PANEL - Abnormal; Notable for the following:       Result Value   Chloride 99 (*)    Glucose, Bld 127 (*)    BUN 51 (*)    Creatinine, Ser 1.67 (*)    Calcium 8.7 (*)    ALT 12 (*)    Total Bilirubin 0.2 (*)    GFR calc non Af Amer 33 (*)    GFR calc  Af Amer 39 (*)    All other components within normal limits  CBC WITH DIFFERENTIAL/PLATELET - Abnormal; Notable for the following:    RBC 3.45 (*)    Hemoglobin 10.1 (*)    HCT 31.0 (*)    RDW 15.9 (*)    All other components within normal limits  URINALYSIS, ROUTINE W REFLEX MICROSCOPIC - Abnormal; Notable for the following:    Protein, ur 30 (*)    All other components within normal limits  CBG MONITORING, ED - Abnormal; Notable for the following:    Glucose-Capillary 136 (*)    All other components within normal  limits  URINALYSIS, MICROSCOPIC (REFLEX)  I-STAT TROPOININ, ED    EKG  EKG Interpretation  Date/Time:  Sunday October 20 2016 21:04:25 EST Ventricular Rate:  61 PR Interval:    QRS Duration: 99 QT Interval:  419 QTC Calculation: 422 R Axis:   27 Text Interpretation:  Sinus rhythm Borderline prolonged PR interval Confirmed by Hazle Coca [phone]) on 10/20/2016 9:17:39 PM       Radiology Ct Abdomen Pelvis Wo Contrast  Result Date: 10/20/2016 CLINICAL DATA:  83 y/o  M; 2 episodes of vomiting. EXAM: CT ABDOMEN AND PELVIS WITHOUT CONTRAST TECHNIQUE: Multidetector CT imaging of the abdomen and pelvis was performed following the standard protocol without IV contrast. COMPARISON:  None. FINDINGS: Lower chest: Minor bibasilar atelectasis. Coronary artery calcifications. Hepatobiliary: No focal liver abnormality is seen. No gallstones, gallbladder wall thickening, or biliary dilatation. Pancreas: Unremarkable. No pancreatic ductal dilatation or surrounding inflammatory changes. Spleen: Normal in size without focal abnormality. Adrenals/Urinary Tract: Calcifications in right renal hilum are probably vascular. Left kidney interpolar fluid attenuating well-circumscribed focus measuring 23 mm is likely a cyst. No urinary stone disease or obstructive uropathy. There are bilateral inguinal hernias of which the left contains a portion of the bladder. Stomach/Bowel: Scattered sigmoid  diverticulosis. No evidence for diverticulitis. Normal appendix. No inflammatory or obstructive changes of the small and large bowel. Tiny hiatal hernia. Vascular/Lymphatic: Aortic atherosclerosis. No enlarged abdominal or pelvic lymph nodes. Reproductive: Enlarged prostate. Other: No ascites. Musculoskeletal: Mild osteoarthrosis of the hips with superolateral acetabular fibrocystic changes. L4 vertebral body bone island. Mild lumbar spondylosis. IMPRESSION: 1. No acute process identified. 2. Bilateral inguinal hernias of which the left contains a portion of the bladder. 3. Aortic atherosclerosis. 4. Prostate hypertrophy. 5. Scattered sigmoid diverticulosis without evidence for diverticulitis. Electronically Signed   By: Kristine Garbe M.D.   On: 10/20/2016 23:26   Dg Chest 1 View  Result Date: 10/20/2016 CLINICAL DATA:  Dementia.  Weakness. EXAM: CHEST 1 VIEW COMPARISON:  Feb 29, 2016 FINDINGS: Probable atelectasis in the lateral left lung base. No nodules or masses. No other interval changes or acute abnormalities. IMPRESSION: Probable left basilar atelectasis.  No other acute abnormalities. Electronically Signed   By: Dorise Bullion III M.D   On: 10/20/2016 20:22   Ct Head Wo Contrast  Result Date: 10/20/2016 CLINICAL DATA:  Acute onset of vomiting. Altered mental status. Initial encounter. EXAM: CT HEAD WITHOUT CONTRAST TECHNIQUE: Contiguous axial images were obtained from the base of the skull through the vertex without intravenous contrast. COMPARISON:  CT of the head performed 12/08/2013 FINDINGS: Brain: No evidence of acute infarction, hemorrhage, hydrocephalus, extra-axial collection or mass lesion/mass effect. Prominence of the ventricles and sulci reflects moderately severe cortical volume loss. Severe cerebellar atrophy is noted. Diffuse periventricular and subcortical white matter change likely reflects small vessel ischemic microangiopathy. Small chronic infarcts are seen at the high  posterior parietal lobes bilaterally. Chronic lacunar infarcts are seen at the right basal ganglia. The brainstem and fourth ventricle are within normal limits. No mass effect or midline shift is seen. Vascular: No hyperdense vessel or unexpected calcification. Skull: There is no evidence of fracture; visualized osseous structures are unremarkable in appearance. Sinuses/Orbits: The visualized portions of the orbits are within normal limits. The paranasal sinuses and mastoid air cells are well-aerated. Other: No significant soft tissue abnormalities are seen. IMPRESSION: 1. No acute intracranial pathology seen on CT. 2. Moderately severe cortical volume loss and diffuse small vessel ischemic microangiopathy. 3. Small chronic infarcts at  the high posterior parietal lobes bilaterally. Small chronic lacunar infarcts at the right basal ganglia. Electronically Signed   By: Garald Balding M.D.   On: 10/20/2016 20:34    Procedures Procedures (including critical care time)  Medications Ordered in ED Medications - No data to display   Initial Impression / Assessment and Plan / ED Course  I have reviewed the triage vital signs and the nursing notes.  Pertinent labs & imaging results that were available during my care of the patient were reviewed by me and considered in my medical decision making (see chart for details).  Clinical Course     Patient with history of dementia here with change in mental status, vomiting. On evaluation the emergency department he is back at his baseline. BMP with stable renal insufficiency. CT head with no acute abnormality. CT abdomen with no evidence of bowel obstruction. Current clinical picture is not consistent with evidence of serious bacterial infection.  Patient is able to eat and drink without difficulty in the ED. Discussed with family unclear source of episode of vomiting. Plan to DC home with outpatient follow-up and return precautions.  Final Clinical Impressions(s)  / ED Diagnoses   Final diagnoses:  Non-intractable vomiting, presence of nausea not specified, unspecified vomiting type    New Prescriptions New Prescriptions   No medications on file     Quintella Reichert, MD 10/21/16 [phone]

## 2016-10-20 NOTE — ED Notes (Signed)
Unable to get successful EKG due to tremors.

## 2016-10-20 NOTE — ED Notes (Signed)
Pt. In xray 

## 2016-10-20 NOTE — ED Notes (Signed)
Bed: WA03 Expected date: 10/20/16 Expected time: 6:14 PM Means of arrival: Ambulance Comments: 83 yo Vomiting

## 2016-10-20 NOTE — ED Triage Notes (Signed)
Per EMS, pt is from [facility] assisted [facility]. Pt family reported two episodes of vomiting. Pt denies diarrhea. Pt is alert to self and that is currently his baseline with a hx of dementia.

## 2016-10-21 DIAGNOSIS — R4182 Altered mental status, unspecified: Secondary | ICD-10-CM | POA: Diagnosis not present

## 2016-10-21 DIAGNOSIS — F0390 Unspecified dementia without behavioral disturbance: Secondary | ICD-10-CM | POA: Diagnosis not present

## 2016-10-21 NOTE — ED Notes (Signed)
Redness noted to pts bottom, barrier cream applied.

## 2016-10-21 NOTE — ED Notes (Signed)
Attempted to call report to facility, no answer or answering machine, line just stops ringing. Will attempt to call again.

## 2016-10-21 NOTE — ED Notes (Signed)
[facility] called for transportation  

## 2016-12-04 DIAGNOSIS — F0390 Unspecified dementia without behavioral disturbance: Secondary | ICD-10-CM | POA: Diagnosis not present

## 2016-12-04 DIAGNOSIS — I10 Essential (primary) hypertension: Secondary | ICD-10-CM | POA: Diagnosis not present

## 2016-12-04 DIAGNOSIS — E1122 Type 2 diabetes mellitus with diabetic chronic kidney disease: Secondary | ICD-10-CM | POA: Diagnosis not present

## 2016-12-04 DIAGNOSIS — Z794 Long term (current) use of insulin: Secondary | ICD-10-CM | POA: Diagnosis not present

## 2017-03-27 DIAGNOSIS — M6281 Muscle weakness (generalized): Secondary | ICD-10-CM | POA: Diagnosis not present

## 2017-03-27 DIAGNOSIS — R531 Weakness: Secondary | ICD-10-CM | POA: Diagnosis not present

## 2017-03-31 DIAGNOSIS — M6281 Muscle weakness (generalized): Secondary | ICD-10-CM | POA: Diagnosis not present

## 2017-03-31 DIAGNOSIS — R531 Weakness: Secondary | ICD-10-CM | POA: Diagnosis not present

## 2017-04-02 DIAGNOSIS — M6281 Muscle weakness (generalized): Secondary | ICD-10-CM | POA: Diagnosis not present

## 2017-04-02 DIAGNOSIS — R531 Weakness: Secondary | ICD-10-CM | POA: Diagnosis not present

## 2017-04-04 DIAGNOSIS — R531 Weakness: Secondary | ICD-10-CM | POA: Diagnosis not present

## 2017-04-04 DIAGNOSIS — M6281 Muscle weakness (generalized): Secondary | ICD-10-CM | POA: Diagnosis not present

## 2017-04-07 DIAGNOSIS — M6281 Muscle weakness (generalized): Secondary | ICD-10-CM | POA: Diagnosis not present

## 2017-04-07 DIAGNOSIS — R531 Weakness: Secondary | ICD-10-CM | POA: Diagnosis not present

## 2017-04-08 DIAGNOSIS — M6281 Muscle weakness (generalized): Secondary | ICD-10-CM | POA: Diagnosis not present

## 2017-04-08 DIAGNOSIS — R531 Weakness: Secondary | ICD-10-CM | POA: Diagnosis not present

## 2017-04-11 DIAGNOSIS — R531 Weakness: Secondary | ICD-10-CM | POA: Diagnosis not present

## 2017-04-11 DIAGNOSIS — M6281 Muscle weakness (generalized): Secondary | ICD-10-CM | POA: Diagnosis not present

## 2017-04-14 DIAGNOSIS — M6281 Muscle weakness (generalized): Secondary | ICD-10-CM | POA: Diagnosis not present

## 2017-04-14 DIAGNOSIS — R531 Weakness: Secondary | ICD-10-CM | POA: Diagnosis not present

## 2017-04-18 DIAGNOSIS — M6281 Muscle weakness (generalized): Secondary | ICD-10-CM | POA: Diagnosis not present

## 2017-04-18 DIAGNOSIS — R531 Weakness: Secondary | ICD-10-CM | POA: Diagnosis not present

## 2017-05-20 ENCOUNTER — Inpatient Hospital Stay (HOSPITAL_COMMUNITY)
Admission: EM | Admit: 2017-05-20 | Discharge: 2017-05-22 | DRG: 177 | Disposition: A | Discharge status: Skilled Nursing Facility | Payer: Medicare Other | Attending: Internal Medicine | Admitting: Internal Medicine

## 2017-05-20 ENCOUNTER — Emergency Department (HOSPITAL_COMMUNITY): Payer: Medicare Other

## 2017-05-20 ENCOUNTER — Encounter (HOSPITAL_COMMUNITY): Payer: Self-pay | Admitting: Emergency Medicine

## 2017-05-20 DIAGNOSIS — E118 Type 2 diabetes mellitus with unspecified complications: Secondary | ICD-10-CM | POA: Diagnosis not present

## 2017-05-20 DIAGNOSIS — R9431 Abnormal electrocardiogram [ECG] [EKG]: Secondary | ICD-10-CM | POA: Diagnosis not present

## 2017-05-20 DIAGNOSIS — Z794 Long term (current) use of insulin: Secondary | ICD-10-CM

## 2017-05-20 DIAGNOSIS — F0390 Unspecified dementia without behavioral disturbance: Secondary | ICD-10-CM | POA: Diagnosis not present

## 2017-05-20 DIAGNOSIS — I2510 Atherosclerotic heart disease of native coronary artery without angina pectoris: Secondary | ICD-10-CM | POA: Diagnosis present

## 2017-05-20 DIAGNOSIS — I10 Essential (primary) hypertension: Secondary | ICD-10-CM | POA: Diagnosis present

## 2017-05-20 DIAGNOSIS — E039 Hypothyroidism, unspecified: Secondary | ICD-10-CM | POA: Diagnosis present

## 2017-05-20 DIAGNOSIS — R1311 Dysphagia, oral phase: Secondary | ICD-10-CM | POA: Diagnosis present

## 2017-05-20 DIAGNOSIS — T17908A Unspecified foreign body in respiratory tract, part unspecified causing other injury, initial encounter: Secondary | ICD-10-CM

## 2017-05-20 DIAGNOSIS — R0600 Dyspnea, unspecified: Secondary | ICD-10-CM

## 2017-05-20 DIAGNOSIS — Z66 Do not resuscitate: Secondary | ICD-10-CM | POA: Diagnosis not present

## 2017-05-20 DIAGNOSIS — J9601 Acute respiratory failure with hypoxia: Secondary | ICD-10-CM | POA: Diagnosis present

## 2017-05-20 DIAGNOSIS — R0902 Hypoxemia: Secondary | ICD-10-CM | POA: Diagnosis not present

## 2017-05-20 DIAGNOSIS — E785 Hyperlipidemia, unspecified: Secondary | ICD-10-CM | POA: Diagnosis present

## 2017-05-20 DIAGNOSIS — Z79899 Other long term (current) drug therapy: Secondary | ICD-10-CM

## 2017-05-20 DIAGNOSIS — J45909 Unspecified asthma, uncomplicated: Secondary | ICD-10-CM | POA: Diagnosis not present

## 2017-05-20 DIAGNOSIS — E1122 Type 2 diabetes mellitus with diabetic chronic kidney disease: Secondary | ICD-10-CM | POA: Diagnosis present

## 2017-05-20 DIAGNOSIS — J690 Pneumonitis due to inhalation of food and vomit: Principal | ICD-10-CM | POA: Diagnosis present

## 2017-05-20 DIAGNOSIS — Z7982 Long term (current) use of aspirin: Secondary | ICD-10-CM

## 2017-05-20 DIAGNOSIS — I129 Hypertensive chronic kidney disease with stage 1 through stage 4 chronic kidney disease, or unspecified chronic kidney disease: Secondary | ICD-10-CM | POA: Diagnosis present

## 2017-05-20 DIAGNOSIS — R1111 Vomiting without nausea: Secondary | ICD-10-CM | POA: Diagnosis not present

## 2017-05-20 DIAGNOSIS — E878 Other disorders of electrolyte and fluid balance, not elsewhere classified: Secondary | ICD-10-CM | POA: Diagnosis present

## 2017-05-20 DIAGNOSIS — R0602 Shortness of breath: Secondary | ICD-10-CM

## 2017-05-20 DIAGNOSIS — E870 Hyperosmolality and hypernatremia: Secondary | ICD-10-CM | POA: Diagnosis not present

## 2017-05-20 DIAGNOSIS — E860 Dehydration: Secondary | ICD-10-CM | POA: Diagnosis not present

## 2017-05-20 DIAGNOSIS — N183 Chronic kidney disease, stage 3 (moderate): Secondary | ICD-10-CM | POA: Diagnosis present

## 2017-05-20 DIAGNOSIS — R1310 Dysphagia, unspecified: Secondary | ICD-10-CM | POA: Diagnosis present

## 2017-05-20 DIAGNOSIS — E119 Type 2 diabetes mellitus without complications: Secondary | ICD-10-CM

## 2017-05-20 HISTORY — DX: Type 2 diabetes mellitus without complications: E11.9

## 2017-05-20 LAB — CBC WITH DIFFERENTIAL/PLATELET
BASOS PCT: 0 %
Basophils Absolute: 0 10*3/uL (ref 0.0–0.1)
EOS ABS: 0.2 10*3/uL (ref 0.0–0.7)
EOS PCT: 2 %
HCT: 31.5 % — ABNORMAL LOW (ref 39.0–52.0)
Hemoglobin: 10.1 g/dL — ABNORMAL LOW (ref 13.0–17.0)
Lymphocytes Relative: 13 %
Lymphs Abs: 1.1 10*3/uL (ref 0.7–4.0)
MCH: 30.1 pg (ref 26.0–34.0)
MCHC: 32.1 g/dL (ref 30.0–36.0)
MCV: 94 fL (ref 78.0–100.0)
MONO ABS: 0.6 10*3/uL (ref 0.1–1.0)
MONOS PCT: 8 %
Neutro Abs: 6.3 10*3/uL (ref 1.7–7.7)
Neutrophils Relative %: 77 %
PLATELETS: 135 10*3/uL — AB (ref 150–400)
RBC: 3.35 MIL/uL — ABNORMAL LOW (ref 4.22–5.81)
RDW: 15.2 % (ref 11.5–15.5)
WBC: 8.2 10*3/uL (ref 4.0–10.5)

## 2017-05-20 LAB — CBG MONITORING, ED: Glucose-Capillary: 101 mg/dL — ABNORMAL HIGH (ref 65–99)

## 2017-05-20 LAB — BASIC METABOLIC PANEL
ANION GAP: 9 (ref 5–15)
BUN: 40 mg/dL — AB (ref 6–20)
CALCIUM: 8.9 mg/dL (ref 8.9–10.3)
CO2: 27 mmol/L (ref 22–32)
Chloride: 113 mmol/L — ABNORMAL HIGH (ref 101–111)
Creatinine, Ser: 1.69 mg/dL — ABNORMAL HIGH (ref 0.61–1.24)
GFR calc Af Amer: 38 mL/min — ABNORMAL LOW (ref >60)
GFR, EST NON AFRICAN AMERICAN: 33 mL/min — AB (ref >60)
GLUCOSE: 124 mg/dL — AB (ref 65–99)
Potassium: 3.7 mmol/L (ref 3.5–5.1)
SODIUM: 149 mmol/L — AB (ref 135–145)

## 2017-05-20 LAB — I-STAT CG4 LACTIC ACID, ED: Lactic Acid, Venous: 0.86 mmol/L (ref 0.5–1.9)

## 2017-05-20 LAB — I-STAT TROPONIN, ED: Troponin i, poc: 0.03 ng/mL (ref 0.00–0.08)

## 2017-05-20 LAB — GLUCOSE, CAPILLARY: GLUCOSE-CAPILLARY: 168 mg/dL — AB (ref 65–99)

## 2017-05-20 MED ORDER — PIPERACILLIN-TAZOBACTAM 3.375 G IVPB
3.3750 g | Freq: Three times a day (TID) | INTRAVENOUS | Status: DC
  Administered 2017-05-21 – 2017-05-22 (×4): 3.375 g via INTRAVENOUS
  Filled 2017-05-20 (×6): qty 50

## 2017-05-20 MED ORDER — HYDROCHLOROTHIAZIDE 25 MG PO TABS
25.0000 mg | ORAL_TABLET | Freq: Every morning | ORAL | Status: DC
  Administered 2017-05-22: 25 mg via ORAL
  Filled 2017-05-20: qty 1

## 2017-05-20 MED ORDER — AMLODIPINE BESYLATE 5 MG PO TABS
2.5000 mg | ORAL_TABLET | Freq: Every morning | ORAL | Status: DC
  Administered 2017-05-22: 2.5 mg via ORAL

## 2017-05-20 MED ORDER — ONDANSETRON HCL 4 MG PO TABS: 4.0000 mg | ORAL_TABLET | Freq: Four times a day (QID) | ORAL | Status: DC | PRN

## 2017-05-20 MED ORDER — INSULIN LISPRO PROT & LISPRO (75-25 MIX) 100 UNIT/ML KWIKPEN: 16.0000 [IU] | PEN_INJECTOR | Freq: Two times a day (BID) | SUBCUTANEOUS | Status: DC

## 2017-05-20 MED ORDER — ENOXAPARIN SODIUM 30 MG/0.3ML ~~LOC~~ SOLN
30.0000 mg | SUBCUTANEOUS | Status: DC
  Administered 2017-05-20 – 2017-05-21 (×2): 30 mg via SUBCUTANEOUS
  Filled 2017-05-20 (×2): qty 0.3

## 2017-05-20 MED ORDER — VITAMIN B-12 1000 MCG PO TABS
1000.0000 ug | ORAL_TABLET | Freq: Every morning | ORAL | Status: DC
  Administered 2017-05-22: 1000 ug via ORAL
  Filled 2017-05-20: qty 1

## 2017-05-20 MED ORDER — ACETAMINOPHEN 500 MG PO TABS
500.0000 mg | ORAL_TABLET | Freq: Three times a day (TID) | ORAL | Status: DC | PRN
  Administered 2017-05-21: 500 mg via ORAL
  Filled 2017-05-20: qty 1

## 2017-05-20 MED ORDER — EZETIMIBE-SIMVASTATIN 10-10 MG PO TABS: 1.0000 | ORAL_TABLET | Freq: Every day | ORAL | Status: DC

## 2017-05-20 MED ORDER — INSULIN ASPART 100 UNIT/ML ~~LOC~~ SOLN
0.0000 [IU] | Freq: Three times a day (TID) | SUBCUTANEOUS | Status: DC
  Administered 2017-05-21: 2 [IU] via SUBCUTANEOUS
  Administered 2017-05-21: 5 [IU] via SUBCUTANEOUS
  Administered 2017-05-21: 2 [IU] via SUBCUTANEOUS
  Administered 2017-05-22: 5 [IU] via SUBCUTANEOUS

## 2017-05-20 MED ORDER — HALOPERIDOL 0.5 MG PO TABS
0.5000 mg | ORAL_TABLET | Freq: Two times a day (BID) | ORAL | Status: DC
  Administered 2017-05-20 – 2017-05-22 (×3): 0.5 mg via ORAL
  Filled 2017-05-20 (×4): qty 1

## 2017-05-20 MED ORDER — ASPIRIN 81 MG PO CHEW
81.0000 mg | CHEWABLE_TABLET | Freq: Every morning | ORAL | Status: DC
  Administered 2017-05-22: 81 mg via ORAL
  Filled 2017-05-20: qty 1

## 2017-05-20 MED ORDER — SIMVASTATIN 10 MG PO TABS
10.0000 mg | ORAL_TABLET | Freq: Every day | ORAL | Status: DC
  Administered 2017-05-20 – 2017-05-21 (×2): 10 mg via ORAL
  Filled 2017-05-20 (×2): qty 1

## 2017-05-20 MED ORDER — LACTATED RINGERS IV SOLN
INTRAVENOUS | Status: DC
  Administered 2017-05-20 – 2017-05-21 (×3): via INTRAVENOUS

## 2017-05-20 MED ORDER — INSULIN ASPART 100 UNIT/ML ~~LOC~~ SOLN: 0.0000 [IU] | Freq: Every day | SUBCUTANEOUS | Status: DC

## 2017-05-20 MED ORDER — CARVEDILOL 12.5 MG PO TABS
12.5000 mg | ORAL_TABLET | Freq: Two times a day (BID) | ORAL | Status: DC
  Administered 2017-05-20 – 2017-05-22 (×4): 12.5 mg via ORAL
  Filled 2017-05-20 (×4): qty 1

## 2017-05-20 MED ORDER — EZETIMIBE 10 MG PO TABS
10.0000 mg | ORAL_TABLET | Freq: Every day | ORAL | Status: DC
  Administered 2017-05-20 – 2017-05-21 (×2): 10 mg via ORAL
  Filled 2017-05-20 (×2): qty 1

## 2017-05-20 MED ORDER — PIPERACILLIN-TAZOBACTAM 3.375 G IVPB 30 MIN
3.3750 g | Freq: Once | INTRAVENOUS | Status: AC
  Administered 2017-05-20: 3.375 g via INTRAVENOUS
  Filled 2017-05-20: qty 50

## 2017-05-20 MED ORDER — LACTATED RINGERS IV SOLN
INTRAVENOUS | Status: DC
  Administered 2017-05-20: 75 mL/h via INTRAVENOUS

## 2017-05-20 MED ORDER — INSULIN LISPRO PROT & LISPRO (75-25 MIX) 100 UNIT/ML ~~LOC~~ SUSP
16.0000 [IU] | Freq: Two times a day (BID) | SUBCUTANEOUS | Status: DC
  Administered 2017-05-21 – 2017-05-22 (×2): 16 [IU] via SUBCUTANEOUS
  Filled 2017-05-20: qty 10

## 2017-05-20 MED ORDER — IPRATROPIUM BROMIDE 0.06 % NA SOLN
2.0000 | Freq: Two times a day (BID) | NASAL | Status: DC
  Administered 2017-05-20 – 2017-05-22 (×2): 2 via NASAL
  Filled 2017-05-20: qty 15

## 2017-05-20 MED ORDER — LEVOTHYROXINE SODIUM 88 MCG PO TABS
88.0000 ug | ORAL_TABLET | Freq: Every day | ORAL | Status: DC
  Administered 2017-05-21 – 2017-05-22 (×2): 88 ug via ORAL
  Filled 2017-05-20 (×2): qty 1

## 2017-05-20 MED ORDER — ONDANSETRON HCL 4 MG/2ML IJ SOLN: 4.0000 mg | Freq: Four times a day (QID) | INTRAMUSCULAR | Status: DC | PRN

## 2017-05-20 NOTE — ED Notes (Signed)
Transporter made aware of transport up to [place].

## 2017-05-20 NOTE — Progress Notes (Signed)
Pharmacy Antibiotic Note  [name] is a 83 y.o. male admitted on 05/20/2017 with presumed aspiration pneumonia.  Pharmacy has been consulted for Zosyn dosing.  Weight as of 10/20/16 = 77kg, with SCr 1.69, CrCl = 28 ml/min.  Plan: Zosyn 3.375g IV given in ED, continue with Zosyn 3.375gm IV q8h (4hr extended infusions) Follow up renal function & cultures    Temp (24hrs), Avg:97.4 F (36.3 C), Min:97.4 F (36.3 C), Max:97.4 F (36.3 C)   Recent Labs Lab 05/20/17 1532 05/20/17 1542  WBC  --  8.2  CREATININE  --  1.69*  LATICACIDVEN 0.86  --     CrCl cannot be calculated (Unknown ideal weight.).    No Known Allergies  Antimicrobials this admission:  8/7 Zosyn >>  Dose adjustments this admission:    Microbiology results:  8/7 BCx: sent  Thank you for allowing pharmacy to be a part of this patient's care.  Peggyann Juba, PharmD, BCPS Pager: [phone] 05/20/2017 6:39 PM

## 2017-05-20 NOTE — ED Notes (Signed)
Suctioned patient's mouth again due to more food seen in it.

## 2017-05-20 NOTE — ED Notes (Signed)
ED Provider at bedside. 

## 2017-05-20 NOTE — ED Notes (Signed)
Hospitalist at bedside 

## 2017-05-20 NOTE — ED Notes (Signed)
Suctioned small amount of food pocketed from patient's mouth that I found upon my triage assessment.

## 2017-05-20 NOTE — ED Notes (Signed)
Patient taken off supplemental O2, so can see if patient's oxygen saturation on room air returns to baseline since patient not normally on O2.

## 2017-05-20 NOTE — ED Provider Notes (Signed)
[facility] DEPT Provider Note   CSN: [id number] Arrival date & time: 05/20/17  1305     History   Chief Complaint Chief Complaint  Patient presents with  . Aspiration    HPI [name] is a 83 y.o. male.  HPI 83 year old male with past medical history of severe dementia here with aspiration event. The patient reportedly was eating grilled cheese today. He is able to eat finger food. He began choking at his nursing facility. He was then noticed to have low oxygen saturations to 86 on room air as well as low blood pressure of 494 systolic. His blood glucose was 101. Patient was subsequently brought to the ED for evaluation. Currently, patient's daughter is at bedside. She states he seems at his baseline, though he has never required oxygen. He is essentially nonverbal according to his daughter. She denies any other recent health or medication changes.  Level 5 caveat invoked as remainder of history, ROS, and physical exam limited due to patient's dementia.   Past Medical History:  Diagnosis Date  . Anemia   . CAD (coronary artery disease)   . Dementia   . DM (diabetes mellitus) ([place])   . Gout   . Hyperlipidemia   . Hypertension   . Prostate cancer [facility])     Patient Active Problem List   Diagnosis Date Noted  . DEMENTIA 12/17/2010  . FULL INCONTINENCE OF FECES 12/17/2010  . DIARRHEA 12/17/2010  . CAD, NATIVE VESSEL 11/20/2010  . HYPERTENSION 11/19/2010    Past Surgical History:  Procedure Laterality Date  . CATARACT EXTRACTION  2000  . [place] Medications    Prior to Admission medications   Medication Sig Start Date End Date Taking? Authorizing Provider  acetaminophen (TYLENOL) 500 MG tablet Take 500 mg by mouth every 8 (eight) hours as needed for moderate pain.   Yes [provider]  amLODipine (NORVASC) 2.5 MG tablet Take 2.5 mg by mouth every morning.   Yes [provider]  aspirin 81 MG chewable tablet Chew 81 mg  by mouth every morning.   Yes [provider]  Calcium Carbonate-Vitamin D (CALCIUM 600+D) 600-400 MG-UNIT tablet Take 1 tablet by mouth 2 (two) times daily.   Yes [provider]  carvedilol (COREG) 12.5 MG tablet Take 12.5 mg by mouth 2 (two) times daily.    Yes [provider]  ezetimibe-simvastatin (VYTORIN) 10-10 MG per tablet Take 1 tablet by mouth at bedtime.     Yes [provider]  ferrous sulfate 325 (65 FE) MG tablet Take 325 mg by mouth daily with breakfast.   Yes [provider]  haloperidol (HALDOL) 0.5 MG tablet Take 0.5 mg by mouth 2 (two) times daily.    Yes [provider]  hydrochlorothiazide (HYDRODIURIL) 25 MG tablet Take 25 mg by mouth every morning.   Yes [provider]  Insulin Lispro Prot & Lispro (HUMALOG 75/25 MIX) (75-25) 100 UNIT/ML Kwikpen Inject 16 Units into the skin 2 (two) times daily.    Yes [provider]  ipratropium (ATROVENT) 0.06 % nasal spray Place 2 sprays into both nostrils 2 (two) times daily.   Yes [provider]  levothyroxine (SYNTHROID, LEVOTHROID) 88 MCG tablet Take 88 mcg by mouth daily before breakfast.   Yes [provider]  loratadine (CLARITIN) 10 MG tablet Take 10 mg by mouth at bedtime.   Yes [provider]  Multiple Vitamins-Minerals (ICAPS AREDS 2  PO) Take 2 capsules by mouth daily.   Yes [provider]  sitaGLIPtin (JANUVIA) 100 MG tablet Take 100 mg by mouth every morning.    Yes [provider]  vitamin B-12 (CYANOCOBALAMIN) 1000 MCG tablet Take 1,000 mcg by mouth every morning.    Yes [provider]    Family History Family History  Problem Relation Age of Onset  . Heart disease Brother   . Diabetes Brother   . Hypertension Mother   . Colon cancer Neg Hx     Social History Social History  Substance Use Topics  . Smoking status: Never Smoker  . Smokeless tobacco: Never Used  . Alcohol use No      Allergies   Patient has no known allergies.   Review of Systems Review of Systems  Unable to perform ROS: Mental status change  Constitutional: Positive for fatigue. Negative for chills and fever.  HENT: Negative for congestion and rhinorrhea.   Eyes: Negative for visual disturbance.  Respiratory: Negative for cough, shortness of breath and wheezing.   Cardiovascular: Negative for chest pain and leg swelling.  Gastrointestinal: Negative for abdominal pain, diarrhea, nausea and vomiting.  Genitourinary: Negative for dysuria and flank pain.  Musculoskeletal: Negative for neck pain and neck stiffness.  Skin: Negative for rash and wound.  Allergic/Immunologic: Negative for immunocompromised state.  Neurological: Negative for syncope, weakness and headaches.  All other systems reviewed and are negative.    Physical Exam Updated Vital Signs BP (!) 134/57 (BP Location: Right Arm)   Pulse 65   Temp (!) 97.4 F (36.3 C) (Oral)   Resp (!) 26   SpO2 (!) 89%   Physical Exam  Constitutional: He is oriented to person, place, and time. He appears well-developed and well-nourished. No distress.  HENT:  Head: Normocephalic and atraumatic.  Mouth/Throat: Oropharynx is clear and moist.  Eyes: Conjunctivae are normal.  Neck: Neck supple.  Cardiovascular: Normal rate, regular rhythm and normal heart sounds.  Exam reveals no friction rub.   No murmur heard. Pulmonary/Chest: Effort normal. Tachypnea noted. No respiratory distress. He has no wheezes. He has rhonchi in the right middle field and the right lower field. He has no rales.  Abdominal: He exhibits no distension.  Musculoskeletal: He exhibits no edema.  Neurological: He is alert and oriented to person, place, and time. He exhibits normal muscle tone.  Skin: Skin is warm. Capillary refill takes less than 2 seconds.  Psychiatric: He has a normal mood and affect.  Nursing note and vitals reviewed.    ED Treatments / Results   Labs (all labs ordered are listed, but only abnormal results are displayed) Labs Reviewed  CBC WITH DIFFERENTIAL/PLATELET - Abnormal; Notable for the following:       Result Value   RBC 3.35 (*)    Hemoglobin 10.1 (*)    HCT 31.5 (*)    Platelets 135 (*)    All other components within normal limits  CBG MONITORING, ED - Abnormal; Notable for the following:    Glucose-Capillary 101 (*)    All other components within normal limits  CULTURE, BLOOD (ROUTINE X 2)  CULTURE, BLOOD (ROUTINE X 2)  BASIC METABOLIC PANEL  CBG MONITORING, ED  I-STAT CG4 LACTIC ACID, ED  I-STAT TROPONIN, ED    EKG  EKG Interpretation  Date/Time:  Tuesday May 20 2017 15:47:34 EDT Ventricular Rate:  67 PR Interval:    QRS Duration: 105 QT Interval:  405 QTC Calculation: 428 R Axis:  8 Text Interpretation:  Appears to be sinus rhythm, but interpretation somewhat limited by artifact Borderline low voltage, extremity leads Abnormal R-wave progression, early transition Artifact in lead(s) I II III aVR aVL V1 V2 No significant change since last tracing Reconfirmed by Duffy Bruce [phone]) on 05/20/2017 4:01:14 PM Also confirmed by Duffy Bruce [phone]), editor Laurena Spies [phone])  on 05/20/2017 4:10:21 PM       Radiology Dg Chest Port 1 View  Result Date: 05/20/2017 CLINICAL DATA:  83 y/o M; shortness of breath. Choked on food this afternoon, possible aspiration. EXAM: PORTABLE CHEST 1 VIEW COMPARISON:  10/20/2016 chest radiograph FINDINGS: Stable normal cardiac silhouette given projection and technique. Aortic atherosclerosis with calcification. No focal consolidation. No pleural effusion or pneumothorax. No acute osseous abnormality is evident. IMPRESSION: No focal consolidation or effusion. Stable cardiac silhouette. Aortic atherosclerosis. Electronically Signed   By: Kristine Garbe M.D.   On: 05/20/2017 14:51    Procedures Procedures (including critical care time)  Medications  Ordered in ED Medications  piperacillin-tazobactam (ZOSYN) IVPB 3.375 g (3.375 g Intravenous New Bag/Given 05/20/17 1556)     Initial Impression / Assessment and Plan / ED Course  I have reviewed the triage vital signs and the nursing notes.  Pertinent labs & imaging results that were available during my care of the patient were reviewed by me and considered in my medical decision making (see chart for details).    83 year old male here with acute hypoxic respiratory failure secondary to suspected aspiration event. Chest x-ray is clear but breath sounds reveal rhonchorous breath sounds in the right lower lobe, concerning for aspiration. He has markedly poor dentition. Lab work shows mild hypernatremia, likely 2/2 dehydration. Will start on Zosyn, mIVF, and admit given persistent hypoxia.  Final Clinical Impressions(s) / ED Diagnoses   Final diagnoses:  Aspiration into airway, initial encounter  Acute respiratory failure with hypoxia [facility])    New Prescriptions New Prescriptions   No medications on file     Duffy Bruce, MD 05/20/17 [id number]

## 2017-05-20 NOTE — ED Triage Notes (Signed)
Per [facility] patient from [facility]  [facility] listed on paperwork), for aspirating while eating lunch.  Patient was feeding himself sandwich.  Patient has dementia and ataxic from previous stroke and currently at baseline per staff at facility.  Patient came in on NRB due to O2 saturation was 88% on room air.  Patient was hypotensive with EMS so administered about 189ml of Normal Saline via IV in route, then BP came up to 161W systolic.    Daughter now at bedside states that patient can feed himself simple things like sandwich.  Reports that he choked couple weeks ago and his sugar was 70, so she felt it was due to his low CBG.

## 2017-05-20 NOTE — ED Notes (Signed)
Bed: IO96 Expected date:  Expected time:  Means of arrival:  Comments: EMS 83 y/o dementia, aspiration?, Room 25

## 2017-05-20 NOTE — H&P (Addendum)
History and Physical    [name] PVX:[id number] DOB: 08/03/1920 DOA: 05/20/2017  PCP: Kathyrn Lass, MD   Patient coming from: Assisted living facility  I have personally briefly reviewed patient's old medical records in [place]  Chief Complaint: Sent from assisted living facility due to aspiration event  HPI: [name] is a 83 y.o. male with medical history significant for advanced dementia who is nonverbal at baseline but still able to eat finger foods by himself, hypertension, coronary artery disease, diabetes, hyperlipidemia, prostate cancer was sent from assisted living facility because of aspiration event that happened today. Patient is demented and does not provide any history. Some of the history is provided by daughter present at bedside and ER provider's documentation was reviewed by myself. Apparently the patient was eating grilled cheese sandwich today and began choking he was sent to the ED for evaluation. Patient's daughter states that he had choking episodes recently as well but did not need hospitalization.  ED Course: Patient was found to be hypoxic. Chest x-ray was negative for infiltrates but patient was started empirically on Zosyn. Hospitalist service was called to evaluate the patient.  Review of Systems: Could not be obtained because of advanced dementia  Past Medical History:  Diagnosis Date  . Anemia   . CAD (coronary artery disease)   . Dementia   . Diabetes ([place]) 05/20/2017  . DM (diabetes mellitus) ([place])   . Gout   . Hyperlipidemia   . Hypertension   . Prostate cancer [facility])   Past medical history was reviewed by myself with medical records  Past Surgical History:  Procedure Laterality Date  . CATARACT EXTRACTION  2000  . FINGER SURGERY  1960   Social history Currently lives in an assisted living facility. No further history could be obtained  No Known Allergies  Family History  Problem Relation Age of Onset  . Heart disease Brother   .  Diabetes Brother   . Hypertension Mother   . Colon cancer Neg Hx   As per prior medical records  Prior to Admission medications   Medication Sig Start Date End Date Taking? Authorizing Provider  acetaminophen (TYLENOL) 500 MG tablet Take 500 mg by mouth every 8 (eight) hours as needed for moderate pain.   Yes [provider]  amLODipine (NORVASC) 2.5 MG tablet Take 2.5 mg by mouth every morning.   Yes [provider]  aspirin 81 MG chewable tablet Chew 81 mg by mouth every morning.   Yes [provider]  Calcium Carbonate-Vitamin D (CALCIUM 600+D) 600-400 MG-UNIT tablet Take 1 tablet by mouth 2 (two) times daily.   Yes [provider]  carvedilol (COREG) 12.5 MG tablet Take 12.5 mg by mouth 2 (two) times daily.    Yes [provider]  ezetimibe-simvastatin (VYTORIN) 10-10 MG per tablet Take 1 tablet by mouth at bedtime.     Yes [provider]  ferrous sulfate 325 (65 FE) MG tablet Take 325 mg by mouth daily with breakfast.   Yes [provider]  haloperidol (HALDOL) 0.5 MG tablet Take 0.5 mg by mouth 2 (two) times daily.    Yes [provider]  hydrochlorothiazide (HYDRODIURIL) 25 MG tablet Take 25 mg by mouth every morning.   Yes [provider]  Insulin Lispro Prot & Lispro (HUMALOG 75/25 MIX) (75-25) 100 UNIT/ML Kwikpen Inject 16 Units into the skin 2 (two) times daily.    Yes [provider]  ipratropium (ATROVENT) 0.06 % nasal spray  Place 2 sprays into both nostrils 2 (two) times daily.   Yes [provider]  levothyroxine (SYNTHROID, LEVOTHROID) 88 MCG tablet Take 88 mcg by mouth daily before breakfast.   Yes [provider]  loratadine (CLARITIN) 10 MG tablet Take 10 mg by mouth at bedtime.   Yes [provider]  Multiple Vitamins-Minerals (ICAPS AREDS 2 PO) Take 2 capsules by mouth daily.   Yes [provider]  sitaGLIPtin (JANUVIA) 100 MG tablet Take 100 mg by  mouth every morning.    Yes [provider]  vitamin B-12 (CYANOCOBALAMIN) 1000 MCG tablet Take 1,000 mcg by mouth every morning.    Yes [provider]    Physical Exam: Vitals:   05/20/17 1331 05/20/17 1459 05/20/17 1559 05/20/17 1601  BP:  139/74 (!) 134/57   Pulse:  67 65   Resp:  20 (!) 26   Temp: (!) 97.4 F (36.3 C)     TempSrc: Oral     SpO2: 94% 100% (!) 89% 97%    Constitutional: NAD, calm, comfortable; nonverbal with intermittent moaning Vitals:   05/20/17 1331 05/20/17 1459 05/20/17 1559 05/20/17 1601  BP:  139/74 (!) 134/57   Pulse:  67 65   Resp:  20 (!) 26   Temp: (!) 97.4 F (36.3 C)     TempSrc: Oral     SpO2: 94% 100% (!) 89% 97%   Eyes: PERRL, lids and conjunctivae normal ENMT: Mucous membranes are dry. Posterior pharynx clear of any exudate or lesions. Neck: normal, supple, no masses, no thyromegaly Respiratory: Bilateral decreased breath sounds bases with some scattered crackles. No accessory muscle use.  Cardiovascular: S1 and S2 positive, rate controlled. No extremity edema.  Abdomen: no tenderness, no masses palpated. No hepatosplenomegaly. Bowel sounds positive.  Extremities: no clubbing / cyanosis/edema.  Neurologic: Nonverbal. Moving extremities. Moaning with tactile stimulus   Labs on Admission: I have personally reviewed following labs and imaging studies  CBC:  Recent Labs Lab 05/20/17 1542  WBC 8.2  NEUTROABS 6.3  HGB 10.1*  HCT 31.5*  MCV 94.0  PLT 417*   Basic Metabolic Panel:  Recent Labs Lab 05/20/17 1542  NA 149*  K 3.7  CL 113*  CO2 27  GLUCOSE 124*  BUN 40*  CREATININE 1.69*  CALCIUM 8.9   GFR: CrCl cannot be calculated (Unknown ideal weight.). Liver Function Tests: No results for input(s): AST, ALT, ALKPHOS, BILITOT, PROT, ALBUMIN in the last 168 hours. No results for input(s): LIPASE, AMYLASE in the last 168 hours. No results for input(s): AMMONIA in the last 168 hours. Coagulation  Profile: No results for input(s): INR, PROTIME in the last 168 hours. Cardiac Enzymes: No results for input(s): CKTOTAL, CKMB, CKMBINDEX, TROPONINI in the last 168 hours. BNP (last 3 results) No results for input(s): PROBNP in the last 8760 hours. HbA1C: No results for input(s): HGBA1C in the last 72 hours. CBG:  Recent Labs Lab 05/20/17 1340  GLUCAP 101*   Lipid Profile: No results for input(s): CHOL, HDL, LDLCALC, TRIG, CHOLHDL, LDLDIRECT in the last 72 hours. Thyroid Function Tests: No results for input(s): TSH, T4TOTAL, FREET4, T3FREE, THYROIDAB in the last 72 hours. Anemia Panel: No results for input(s): VITAMINB12, FOLATE, FERRITIN, TIBC, IRON, RETICCTPCT in the last 72 hours. Urine analysis:    Component Value Date/Time   COLORURINE YELLOW 10/20/2016 2200   APPEARANCEUR CLEAR 10/20/2016 2200   LABSPEC 1.015 10/20/2016 2200   PHURINE 6.5 10/20/2016 2200   GLUCOSEU NEGATIVE 10/20/2016 2200  HGBUR NEGATIVE 10/20/2016 2200   BILIRUBINUR NEGATIVE 10/20/2016 2200   KETONESUR NEGATIVE 10/20/2016 2200   PROTEINUR 30 (A) 10/20/2016 2200   UROBILINOGEN 0.2 12/08/2013 1259   NITRITE NEGATIVE 10/20/2016 2200   LEUKOCYTESUR NEGATIVE 10/20/2016 2200    Radiological Exams on Admission: Dg Chest Port 1 View  Result Date: 05/20/2017 CLINICAL DATA:  83 y/o M; shortness of breath. Choked on food this afternoon, possible aspiration. EXAM: PORTABLE CHEST 1 VIEW COMPARISON:  10/20/2016 chest radiograph FINDINGS: Stable normal cardiac silhouette given projection and technique. Aortic atherosclerosis with calcification. No focal consolidation. No pleural effusion or pneumothorax. No acute osseous abnormality is evident. IMPRESSION: No focal consolidation or effusion. Stable cardiac silhouette. Aortic atherosclerosis. Electronically Signed   By: Kristine Garbe M.D.   On: 05/20/2017 14:51      Assessment/Plan Active Problems:   Hypoxia   Essential hypertension   CAD, NATIVE  VESSEL   Dementia   Diabetes ([place])   Hypoxia most likely secondary to aspiration - Continue oxygen supplementation. Continue Zosyn empirically and repeat chest x-ray for tomorrow. - Nothing by mouth except for meds - SLP evaluation - Aspiration precautions  Hypernatremia and hyperchloremia probably from dehydration - IV fluids. Repeat a.m. Labs  Chronic kidney disease stage III - Creatinine at baseline. Repeat a.m. Labs  Advanced dementia - At baseline as per patient's daughter. If condition worsens, consider palliative care consult - Fall precautions  Hypertension - Monitor blood pressure. Continue amlodipine, hydrochlorothiazide, Coreg  Diabetes mellitus type 2 - Continue Humalog 75/25 along with sliding scale insulin. Hold Januvia  Hyperlipidemia - Continue Vytorin  Hypothyroidism - Continue Synthroid  Coronary artery disease - Stable. Continue aspirin, Coreg and statin    DVT prophylaxis: Lovenox Code Status: DO NOT RESUSCITATE Family Communication: Discussed with daughter at bedside Disposition Plan: Probable back to assisted living facility if condition improves Consults called: None Admission status: Observation  Severity of Illness: The appropriate patient status for this patient is OBSERVATION. Observation status is judged to be reasonable and necessary in order to provide the required intensity of service to ensure the patient's safety. The patient's presenting symptoms, physical exam findings, and initial radiographic and laboratory data in the context of their medical condition is felt to place them at decreased risk for further clinical deterioration. Furthermore, it is anticipated that the patient will be medically stable for discharge from the hospital within 2 midnights of admission. The following factors support the patient status of observation.   " The patient's presenting symptoms include aspiration. " The physical exam findings include  hypoxia " The initial radiographic and laboratory data are dehydration/hypernatremia    Aline August MD [facility] Pager [phone]  If 7PM-7AM, please contact night-coverage [email or web address] Password [email or web address]  05/20/2017, 5:10 PM

## 2017-05-21 ENCOUNTER — Observation Stay (HOSPITAL_COMMUNITY): Payer: Medicare Other

## 2017-05-21 DIAGNOSIS — F0390 Unspecified dementia without behavioral disturbance: Secondary | ICD-10-CM | POA: Diagnosis not present

## 2017-05-21 DIAGNOSIS — E039 Hypothyroidism, unspecified: Secondary | ICD-10-CM | POA: Diagnosis present

## 2017-05-21 DIAGNOSIS — I129 Hypertensive chronic kidney disease with stage 1 through stage 4 chronic kidney disease, or unspecified chronic kidney disease: Secondary | ICD-10-CM | POA: Diagnosis present

## 2017-05-21 DIAGNOSIS — Z794 Long term (current) use of insulin: Secondary | ICD-10-CM

## 2017-05-21 DIAGNOSIS — T17908A Unspecified foreign body in respiratory tract, part unspecified causing other injury, initial encounter: Secondary | ICD-10-CM | POA: Diagnosis not present

## 2017-05-21 DIAGNOSIS — J9601 Acute respiratory failure with hypoxia: Secondary | ICD-10-CM

## 2017-05-21 DIAGNOSIS — Z79899 Other long term (current) drug therapy: Secondary | ICD-10-CM | POA: Diagnosis not present

## 2017-05-21 DIAGNOSIS — E860 Dehydration: Secondary | ICD-10-CM | POA: Diagnosis present

## 2017-05-21 DIAGNOSIS — E878 Other disorders of electrolyte and fluid balance, not elsewhere classified: Secondary | ICD-10-CM | POA: Diagnosis present

## 2017-05-21 DIAGNOSIS — I10 Essential (primary) hypertension: Secondary | ICD-10-CM

## 2017-05-21 DIAGNOSIS — J690 Pneumonitis due to inhalation of food and vomit: Secondary | ICD-10-CM | POA: Diagnosis not present

## 2017-05-21 DIAGNOSIS — R1311 Dysphagia, oral phase: Secondary | ICD-10-CM | POA: Diagnosis present

## 2017-05-21 DIAGNOSIS — I2510 Atherosclerotic heart disease of native coronary artery without angina pectoris: Secondary | ICD-10-CM | POA: Diagnosis present

## 2017-05-21 DIAGNOSIS — J9600 Acute respiratory failure, unspecified whether with hypoxia or hypercapnia: Secondary | ICD-10-CM | POA: Diagnosis not present

## 2017-05-21 DIAGNOSIS — R0902 Hypoxemia: Secondary | ICD-10-CM

## 2017-05-21 DIAGNOSIS — Z66 Do not resuscitate: Secondary | ICD-10-CM | POA: Diagnosis present

## 2017-05-21 DIAGNOSIS — E118 Type 2 diabetes mellitus with unspecified complications: Secondary | ICD-10-CM

## 2017-05-21 DIAGNOSIS — Z7982 Long term (current) use of aspirin: Secondary | ICD-10-CM | POA: Diagnosis not present

## 2017-05-21 DIAGNOSIS — E785 Hyperlipidemia, unspecified: Secondary | ICD-10-CM | POA: Diagnosis present

## 2017-05-21 DIAGNOSIS — E1122 Type 2 diabetes mellitus with diabetic chronic kidney disease: Secondary | ICD-10-CM | POA: Diagnosis present

## 2017-05-21 DIAGNOSIS — R1310 Dysphagia, unspecified: Secondary | ICD-10-CM | POA: Diagnosis present

## 2017-05-21 DIAGNOSIS — R0602 Shortness of breath: Secondary | ICD-10-CM

## 2017-05-21 DIAGNOSIS — N183 Chronic kidney disease, stage 3 (moderate): Secondary | ICD-10-CM | POA: Diagnosis present

## 2017-05-21 DIAGNOSIS — E870 Hyperosmolality and hypernatremia: Secondary | ICD-10-CM | POA: Diagnosis present

## 2017-05-21 LAB — CBC
HEMATOCRIT: 28.6 % — AB (ref 39.0–52.0)
Hemoglobin: 9.2 g/dL — ABNORMAL LOW (ref 13.0–17.0)
MCH: 29.4 pg (ref 26.0–34.0)
MCHC: 32.2 g/dL (ref 30.0–36.0)
MCV: 91.4 fL (ref 78.0–100.0)
Platelets: 124 10*3/uL — ABNORMAL LOW (ref 150–400)
RBC: 3.13 MIL/uL — ABNORMAL LOW (ref 4.22–5.81)
RDW: 15.1 % (ref 11.5–15.5)
WBC: 9.9 10*3/uL (ref 4.0–10.5)

## 2017-05-21 LAB — GLUCOSE, CAPILLARY
GLUCOSE-CAPILLARY: 142 mg/dL — AB (ref 65–99)
GLUCOSE-CAPILLARY: 156 mg/dL — AB (ref 65–99)
Glucose-Capillary: 148 mg/dL — ABNORMAL HIGH (ref 65–99)
Glucose-Capillary: 211 mg/dL — ABNORMAL HIGH (ref 65–99)

## 2017-05-21 LAB — MRSA PCR SCREENING: MRSA by PCR: POSITIVE — AB

## 2017-05-21 LAB — BASIC METABOLIC PANEL
Anion gap: 8 (ref 5–15)
BUN: 35 mg/dL — AB (ref 6–20)
CHLORIDE: 112 mmol/L — AB (ref 101–111)
CO2: 26 mmol/L (ref 22–32)
Calcium: 8.5 mg/dL — ABNORMAL LOW (ref 8.9–10.3)
Creatinine, Ser: 1.67 mg/dL — ABNORMAL HIGH (ref 0.61–1.24)
GFR calc Af Amer: 38 mL/min — ABNORMAL LOW (ref >60)
GFR calc non Af Amer: 33 mL/min — ABNORMAL LOW (ref >60)
GLUCOSE: 146 mg/dL — AB (ref 65–99)
POTASSIUM: 3.1 mmol/L — AB (ref 3.5–5.1)
Sodium: 146 mmol/L — ABNORMAL HIGH (ref 135–145)

## 2017-05-21 MED ORDER — MUPIROCIN 2 % EX OINT
1.0000 "application " | TOPICAL_OINTMENT | Freq: Two times a day (BID) | CUTANEOUS | Status: DC
  Administered 2017-05-21 – 2017-05-22 (×2): 1 via NASAL
  Filled 2017-05-21: qty 22

## 2017-05-21 MED ORDER — RESOURCE THICKENUP CLEAR PO POWD
ORAL | Status: DC | PRN
  Administered 2017-05-21: 18:00:00 via ORAL
  Filled 2017-05-21: qty 125

## 2017-05-21 MED ORDER — POTASSIUM CHLORIDE 10 MEQ/100ML IV SOLN
10.0000 meq | INTRAVENOUS | Status: AC
Start: 2017-05-21 — End: 2017-05-21
  Administered 2017-05-21 (×4): 10 meq via INTRAVENOUS
  Filled 2017-05-21 (×4): qty 100

## 2017-05-21 MED ORDER — CHLORHEXIDINE GLUCONATE CLOTH 2 % EX PADS: 6.0000 | MEDICATED_PAD | Freq: Every day | CUTANEOUS | Status: DC

## 2017-05-21 NOTE — Care Management Obs Status (Signed)
[place] NOTIFICATION   Patient Details  Name: Artrell Lawless MRN: [id number] Date of Birth: [birth year]   Medicare Observation Status Notification Given:  Other (see comment) Patient not able to sign document/no family in room/copy of the medicare outpatient observation notice left in room with my name and number for family contact and questions.   Leeroy Cha, RN 05/21/2017, 11:24 AM

## 2017-05-21 NOTE — Progress Notes (Signed)
CRITICAL VALUE ALERT  Critical Value:  postive MRSA pcr   Date & Time Notied:  05/21/2017 1506  Provider Notified: Cruzita Lederer  Orders Received/Actions taken: mrsa positive order set available.

## 2017-05-21 NOTE — Evaluation (Addendum)
Clinical/Bedside Swallow Evaluation Patient Details  Name: [name] MRN: [id number] Date of Birth: [birth year]  Today's Date: 05/21/2017 Time: SLP Start Time (ACUTE ONLY): 1000 SLP Stop Time (ACUTE ONLY): 1030 SLP Time Calculation (min) (ACUTE ONLY): 30 min  Past Medical History:  Past Medical History:  Diagnosis Date  . Anemia   . CAD (coronary artery disease)   . Dementia   . Diabetes ([place]) 05/20/2017  . DM (diabetes mellitus) ([place])   . Gout   . Hyperlipidemia   . Hypertension   . Prostate cancer [facility])    Past Surgical History:  Past Surgical History:  Procedure Laterality Date  . CATARACT EXTRACTION  2000  . FINGER SURGERY  1960   HPI:  [name] is a 83 y.o. male with medical history significant for advanced dementia who is nonverbal at baseline but still able to eat finger foods by himself, hypertension, coronary artery disease, diabetes, hyperlipidemia, prostate cancer per MD note.   The patient was eating grilled cheese sandwich and began choking he was sent to the ED for evaluation per ED MD note. Patient's daughter states that he had choking episodes recently per Md notes.  CXR negative today and yesterday for acute changes.     Assessment / Plan / Recommendation Clinical Impression  Pt presents with cognitive related dysphagia resulting in oral holding/suspected delayed oral transiting.  Concern for aspiration of thin liquid present likely due to premature spillage into pharynx/larynx however tsps amount tolerated well.    Delayed swallow noted across all consistencies but pharyngeal swallow clinically appeared strong without evidence of residuals.  SLP did not test solids due to aspiration on grilled cheese and choking prior to admission.  Pt will remain high aspiration risk due to reliance on others for feeding and delays/oral control deficits. Recommend dys1/nectar with full supervision and strict precautions.  Informed RN and posted signs in room.  Will follow up for  family education.   SLP Visit Diagnosis: Dysphagia, oral phase (R13.11)    Aspiration Risk  Severe aspiration risk;Risk for inadequate nutrition/hydration    Diet Recommendation Dysphagia 1 (Puree);Nectar-thick liquid (tsps thin water between meals ok)   Liquid Administration via: Spoon;Cup;Straw Medication Administration: Whole meds with puree Supervision: Staff to assist with self feeding Compensations: Slow rate;Small sips/bites;Other (Comment) (oral suction before and after po intake)    Other  Recommendations Oral Care Recommendations: Oral care QID Other Recommendations: Order thickener from pharmacy   Follow up Recommendations        Frequency and Duration min 1 x/week  1 week       Prognosis Prognosis for Safe Diet Advancement: Fair Barriers to Reach Goals: Time post onset;Cognitive deficits      Swallow Study   General Date of Onset: 05/21/17 HPI: [name] is a 83 y.o. male with medical history significant for advanced dementia who is nonverbal at baseline but still able to eat finger foods by himself, hypertension, coronary artery disease, diabetes, hyperlipidemia, prostate cancer per MD note.   The patient was eating grilled cheese sandwich and began choking he was sent to the ED for evaluation per ED MD note. Patient's daughter states that he had choking episodes recently per Md notes.  CXR negative today and yesterday for acute changes.   Type of Study: Bedside Swallow Evaluation Diet Prior to this Study: NPO Temperature Spikes Noted: No History of Recent Intubation: No Behavior/Cognition: Cooperative;Alert Oral Cavity Assessment: Other (comment);Dry Oral Care Completed by SLP: Yes (oral suction set up and used  to clean mouth, pt clamped jaw on sponge and therefore SLP did not use toothette for further cleaning and advised RN not to place sponges or breakable items in his mouth) Oral Cavity - Dentition: Adequate natural dentition Vision: Functional for  self-feeding Self-Feeding Abilities: Total assist Patient Positioning: Upright in bed Baseline Vocal Quality: Low vocal intensity Volitional Cough: Cognitively unable to elicit Volitional Swallow: Unable to elicit    Oral/Motor/Sensory Function Overall Oral Motor/Sensory Function:  (pt did not follow directions, able to close jaw, ? slight left facial asymmetry)   Ice Chips Ice chips: Not tested   Thin Liquid Thin Liquid: Impaired Presentation: Straw;Spoon Oral Phase Impairments: Reduced lingual movement/coordination Oral Phase Functional Implications: Prolonged oral transit Pharyngeal  Phase Impairments: Cough - Immediate Other Comments: via straw, tsps tolerated without indication of airway compromise    Nectar Thick Nectar Thick Liquid: Impaired Presentation: Spoon;Straw Oral Phase Impairments: Reduced lingual movement/coordination Oral phase functional implications: Prolonged oral transit   Honey Thick Honey Thick Liquid: Not tested   Puree Puree: Impaired Presentation: Spoon Oral Phase Impairments: Reduced lingual movement/coordination Oral Phase Functional Implications: Prolonged oral transit   Solid   GO   Solid: Not tested    Functional Assessment Tool Used: clinical judgement Functional Limitations: Swallowing Swallow Current Status (I9784): At least 60 percent but less than 80 percent impaired, limited or restricted Swallow Goal Status [phone]): At least 40 percent but less than 60 percent impaired, limited or restricted   [name] 05/21/2017,11:02 AM  [name], [facility] SLP [phone]

## 2017-05-21 NOTE — Care Management Note (Signed)
Case Management Note  Patient Details  Name: [name] MRN: [id number] Date of Birth: [birth year]  Subjective/Objective:     Aspiration pna               Action/Plan: Date:  May 21, 2017 Chart reviewed for concurrent status and case management needs. Will continue to follow patient progress. Discharge Planning: following for needs Expected discharge date: [id number] Velva Harman, BSN, [place], [place]  Expected Discharge Date:   (unknown)               Expected Discharge Plan:  Assisted Living / Rest Home  In-House Referral:  Clinical Social Work  Discharge planning Services  CM Consult  Post Acute Care Choice:    Choice offered to:     DME Arranged:    DME Agency:     HH Arranged:    [place] Agency:     Status of Service:  In process, will continue to follow  If discussed at Long Length of Stay Meetings, dates discussed:    Additional Comments:  Leeroy Cha, RN 05/21/2017, 8:28 AM

## 2017-05-21 NOTE — Progress Notes (Signed)
PROGRESS NOTE  [name] FBP:[id number] DOB: April 04, 1920 DOA: 05/20/2017 PCP: Kathyrn Lass, MD   LOS: 0 days   Brief Narrative / Interim history: 83 y.o. male with medical history significant for advanced dementia who is nonverbal at baseline but still able to eat finger foods by himself, hypertension, coronary artery disease, diabetes, hyperlipidemia, prostate cancer was sent from assisted living facility because of aspiration event that happened on 8/7.  He was hypoxic on admission.  Assessment & Plan: Active Problems:   Essential hypertension   CAD, NATIVE VESSEL   Dementia   Hypoxia   Diabetes ([place])   Acute hypoxic respiratory failure -Likely secondary to aspiration pneumonia/pneumonitis -Patient on minimal oxygen this morning, 1 L, wean off as tolerated  Aspiration pneumonia -Started on Zosyn, continue.  Will need to be transitioned to Augmentin on discharge.  Dementia/dysphagia -Speech consulted, recommended dysphagia 1 diet, this would be recommended to continue on discharge.  Will monitor inpatient for today, see how he does and consider discharge back to his SNF tomorrow  Hypertension -Continue home regimen  Diabetes mellitus type 2 -Continue home insulin regimen plus sliding scale  Hyperlipidemia -Continue home medications  Hypothyroidism -Continue Synthroid  CAD -continue Aspirin, coreg, statin   DVT prophylaxis: lovenox Code Status: DNR Family Communication: daughter bedside Disposition Plan: ALF in 1 day if tolerates diet  Consultants:   None   Procedures:   None   Antimicrobials:  Zosyn 8/7 >>   Subjective: -Nonverbal, intermittently following commands, alert and is able to track me in the room  Objective: Vitals:   05/20/17 1842 05/20/17 2115 05/21/17 0455 05/21/17 1315  BP: (!) 148/83 (!) 161/67 130/60 (!) 147/66  Pulse: 77 65 66 66  Resp: 18 20 20 18   Temp: 98.2 F (36.8 C) 98.1 F (36.7 C) 98.1 F (36.7 C) 99 F (37.2 C)    TempSrc: Axillary Oral Oral Oral  SpO2: 95% 100% 100% 96%  Weight: 77.3 kg (170 lb 6.7 oz)     Height: 5\' 5"  (1.651 m)       Intake/Output Summary (Last 24 hours) at 05/21/17 1352 Last data filed at 05/21/17 0513  Gross per 24 hour  Intake              125 ml  Output              400 ml  Net             -275 ml   Filed Weights   05/20/17 1842  Weight: 77.3 kg (170 lb 6.7 oz)    Examination:  Vitals:   05/20/17 1842 05/20/17 2115 05/21/17 0455 05/21/17 1315  BP: (!) 148/83 (!) 161/67 130/60 (!) 147/66  Pulse: 77 65 66 66  Resp: 18 20 20 18   Temp: 98.2 F (36.8 C) 98.1 F (36.7 C) 98.1 F (36.7 C) 99 F (37.2 C)  TempSrc: Axillary Oral Oral Oral  SpO2: 95% 100% 100% 96%  Weight: 77.3 kg (170 lb 6.7 oz)     Height: 5\' 5"  (1.651 m)       Constitutional: NAD Eyes: lids and conjunctivae normal ENMT: Mucous membranes are dry Respiratory: clear to auscultation bilaterally, no wheezing, no crackles. Cardiovascular: Regular rate and rhythm, no murmurs / rubs / gallops. Abdomen: no tenderness. Bowel sounds positive.  Neurologic: moves all 4, no apparent focal findings  Data Reviewed: I have independently reviewed following labs and imaging studies   CBC:  Recent Labs Lab 05/20/17 1542 05/21/17 0708  WBC 8.2 9.9  NEUTROABS 6.3  --   HGB 10.1* 9.2*  HCT 31.5* 28.6*  MCV 94.0 91.4  PLT 135* 119*   Basic Metabolic Panel:  Recent Labs Lab 05/20/17 1542 05/21/17 0708  NA 149* 146*  K 3.7 3.1*  CL 113* 112*  CO2 27 26  GLUCOSE 124* 146*  BUN 40* 35*  CREATININE 1.69* 1.67*  CALCIUM 8.9 8.5*   GFR: Estimated Creatinine Clearance: 25.4 mL/min (A) (by C-G formula based on SCr of 1.67 mg/dL (H)). Liver Function Tests: No results for input(s): AST, ALT, ALKPHOS, BILITOT, PROT, ALBUMIN in the last 168 hours. No results for input(s): LIPASE, AMYLASE in the last 168 hours. No results for input(s): AMMONIA in the last 168 hours. Coagulation Profile: No results  for input(s): INR, PROTIME in the last 168 hours. Cardiac Enzymes: No results for input(s): CKTOTAL, CKMB, CKMBINDEX, TROPONINI in the last 168 hours. BNP (last 3 results) No results for input(s): PROBNP in the last 8760 hours. HbA1C: No results for input(s): HGBA1C in the last 72 hours. CBG:  Recent Labs Lab 05/20/17 1340 05/20/17 2118 05/21/17 0742 05/21/17 1206  GLUCAP 101* 168* 148* 142*   Lipid Profile: No results for input(s): CHOL, HDL, LDLCALC, TRIG, CHOLHDL, LDLDIRECT in the last 72 hours. Thyroid Function Tests: No results for input(s): TSH, T4TOTAL, FREET4, T3FREE, THYROIDAB in the last 72 hours. Anemia Panel: No results for input(s): VITAMINB12, FOLATE, FERRITIN, TIBC, IRON, RETICCTPCT in the last 72 hours. Urine analysis:    Component Value Date/Time   COLORURINE YELLOW 10/20/2016 2200   APPEARANCEUR CLEAR 10/20/2016 2200   LABSPEC 1.015 10/20/2016 2200   PHURINE 6.5 10/20/2016 2200   GLUCOSEU NEGATIVE 10/20/2016 2200   HGBUR NEGATIVE 10/20/2016 2200   BILIRUBINUR NEGATIVE 10/20/2016 2200   KETONESUR NEGATIVE 10/20/2016 2200   PROTEINUR 30 (A) 10/20/2016 2200   UROBILINOGEN 0.2 12/08/2013 1259   NITRITE NEGATIVE 10/20/2016 2200   LEUKOCYTESUR NEGATIVE 10/20/2016 2200   Sepsis Labs: Invalid input(s): PROCALCITONIN, LACTICIDVEN  Recent Results (from the past 240 hour(s))  Blood culture (routine x 2)     Status: None (Preliminary result)   Collection Time: 05/20/17  3:42 PM  Result Value Ref Range Status   Specimen Description BLOOD LEFT FOREARM  Final   Special Requests   Final    BOTTLES DRAWN AEROBIC AND ANAEROBIC Blood Culture adequate volume   Culture   Final    NO GROWTH < 24 HOURS Performed at [facility], [address] [place], [place] [address]    Report Status PENDING  Incomplete  Blood culture (routine x 2)     Status: None (Preliminary result)   Collection Time: 05/20/17  3:42 PM  Result Value Ref Range Status   Specimen  Description BLOOD RIGHT HAND  Final   Special Requests IN PEDIATRIC BOTTLE Blood Culture adequate volume  Final   Culture   Final    NO GROWTH < 24 HOURS Performed at [facility], [place] [address] [place], [place] [address]    Report Status PENDING  Incomplete      Radiology Studies: X-ray Chest Pa And Lateral  Result Date: 05/21/2017 CLINICAL DATA:  Shortness of Breath EXAM: CHEST  2 VIEW COMPARISON:  05/20/2017 FINDINGS: Cardiac shadow is within normal limits. The aorta again demonstrates calcific changes. No focal infiltrate or sizable effusion is seen. No acute bony abnormality is noted. IMPRESSION: No acute abnormality noted. No significant change from the prior exam. Aortic Atherosclerosis (ICD10-170.0)  Electronically Signed   By: Inez Catalina M.D.   On: 05/21/2017 09:44   Dg Chest Port 1 View  Result Date: 05/20/2017 CLINICAL DATA:  83 y/o M; shortness of breath. Choked on food this afternoon, possible aspiration. EXAM: PORTABLE CHEST 1 VIEW COMPARISON:  10/20/2016 chest radiograph FINDINGS: Stable normal cardiac silhouette given projection and technique. Aortic atherosclerosis with calcification. No focal consolidation. No pleural effusion or pneumothorax. No acute osseous abnormality is evident. IMPRESSION: No focal consolidation or effusion. Stable cardiac silhouette. Aortic atherosclerosis. Electronically Signed   By: Kristine Garbe M.D.   On: 05/20/2017 14:51     Scheduled Meds: . amLODipine  2.5 mg Oral q morning - 10a  . aspirin  81 mg Oral q morning - 10a  . carvedilol  12.5 mg Oral BID WC  . enoxaparin (LOVENOX) injection  30 mg Subcutaneous Q24H  . ezetimibe  10 mg Oral QHS   And  . simvastatin  10 mg Oral QHS  . haloperidol  0.5 mg Oral BID  . hydrochlorothiazide  25 mg Oral q morning - 10a  . insulin aspart  0-15 Units Subcutaneous TID WC  . insulin aspart  0-5 Units Subcutaneous QHS  . insulin lispro protamine-lispro  16 Units Subcutaneous BID WC  .  ipratropium  2 spray Each Nare BID  . levothyroxine  88 mcg Oral QAC breakfast  . vitamin B-12  1,000 mcg Oral q morning - 10a   Continuous Infusions: . lactated ringers 75 mL/hr at 05/20/17 2124  . piperacillin-tazobactam (ZOSYN)  IV Stopped (05/21/17 1158)  . potassium chloride       Marzetta Board, MD, PhD [facility] Pager [phone]  If 7PM-7AM, please contact night-coverage [email or web address] Password [email or web address] 05/21/2017, 1:52 PM

## 2017-05-21 NOTE — Progress Notes (Signed)
PT Cancellation Note  Patient Details Name: [name] MRN: [id number] DOB: 1919/12/31   Cancelled Treatment:    Reason Eval/Treat Not Completed: PT screened, no needs identified, will sign off. Per chart, pt has advanced dementia. Spoke with staff at ALF [facility])-pt is +2 assist for mobility at baseline. Would recommend pt return to ALF once medically stable. Please reorder if d/c plans change (i.e ALF cannot continue to provide +2 assist/pt requires SNF placement). Thanks.    Weston Anna, MPT Pager: [phone]

## 2017-05-22 DIAGNOSIS — R0602 Shortness of breath: Secondary | ICD-10-CM

## 2017-05-22 DIAGNOSIS — J9601 Acute respiratory failure with hypoxia: Secondary | ICD-10-CM

## 2017-05-22 LAB — GLUCOSE, CAPILLARY
GLUCOSE-CAPILLARY: 120 mg/dL — AB (ref 65–99)
GLUCOSE-CAPILLARY: 229 mg/dL — AB (ref 65–99)

## 2017-05-22 MED ORDER — AMOXICILLIN-POT CLAVULANATE 875-125 MG PO TABS: 1.0000 | ORAL_TABLET | Freq: Two times a day (BID) | ORAL | 0 refills | Status: DC

## 2017-05-22 NOTE — Progress Notes (Signed)
  Speech Language Pathology Treatment: Dysphagia  Patient Details Name: [name] MRN: [id number] DOB: January 03, 1920 Today's Date: 05/22/2017 Time: 1008-1040 SLP Time Calculation (min) (ACUTE ONLY): 32 min  Assessment / Plan / Recommendation Clinical Impression  Pt today with much improved timeliness/efficiency of swallow.  Able to hold his own cup today and consume sequential boluses of thin (16 ounces) with no indication of airway compromise.  Trials of advanced solids given with adequate rotary mastication pattern and no oral residuals observed.  RN provided pt with medicine with pudding - with food tolerance.    Per notes in chart, pt with recent coughing on solids PTA, therefore would recommend to continue PUREE and advance to THIN liquid diet with follow up SLP at next venue of care to maximize pt ability for diet advancement.  Pt will benefit from continued full supervision given recent aspiration episode.  Of note, he did voice x3 during session stating "ice cream" and "yes", "no" - hoarse voice with decreased phonatory strength noted.  Thanks for allowing me to help care for this pt.      HPI HPI: [name] is a 83 y.o. male with medical history significant for advanced dementia who is nonverbal at baseline but still able to eat finger foods by himself, hypertension, coronary artery disease, diabetes, hyperlipidemia, prostate cancer per MD note.   The patient was eating grilled cheese sandwich and began choking he was sent to the ED for evaluation per ED MD note. Patient's daughter states that he had choking episodes recently per Md notes.  CXR negative today and yesterday for acute changes.        SLP Plan  All goals met (pt to dc today, recommend follow up at next venue )       Recommendations  Diet recommendations: Dysphagia 1 (puree);Thin liquid Liquids provided via: Straw;Cup Medication Administration: Whole meds with puree Supervision: Staff to assist with self feeding;Full  supervision/cueing for compensatory strategies Compensations: Slow rate;Small sips/bites;Other (Comment) (oral suction before and after po intake)                Oral Care Recommendations: Oral care QID SLP Visit Diagnosis: Dysphagia, oral phase (R13.11) Plan: All goals met (pt to dc today, recommend follow up at next venue )       [place], [place] [facility] SLP [phone]  [name] 05/22/2017, 11:12 AM

## 2017-05-22 NOTE — Progress Notes (Addendum)
Facility reviewing patient information, will inform csw when patient can return. [place] w/ [facility] admission confirms patient can return.  [facility] called for transport. ETA: 1 hour before pick up.  Daughter at bedside and updated.    Kathrin Greathouse, Latanya Presser, MSW Clinical Social Worker 5E and Psychiatric Service Line [phone] 05/22/2017  11:28 AM

## 2017-05-22 NOTE — Discharge Summary (Addendum)
Physician Discharge Summary  Edenilson Austad OVF:[id number] DOB: Jun 15, 1920 DOA: 05/20/2017  PCP: Kathyrn Lass, MD  Admit date: 05/20/2017 Discharge date: 05/22/2017  Admitted From: ALF Disposition:  ALF  Recommendations for Outpatient Follow-up:  1. Follow up with PCP in 1-2 weeks 2. Please obtain BMP/CBC in one week 3. Continue Augmentin for 3 more days for aspiration pneumonia. End date 05/25/2017 4. Please have speech re-evaluate patient after treatment  Home Health: none Equipment/Devices: none  Discharge Condition: stable CODE STATUS: DNR Diet recommendation: dysphagia 1 thin liquids  HPI: Per Dr. Remi Haggard rl [place] is a 83 y.o. male with medical history significant for advanced dementia who is nonverbal at baseline but still able to eat finger foods by himself, hypertension, coronary artery disease, diabetes, hyperlipidemia, prostate cancer was sent from assisted living facility because of aspiration event that happened today. Patient is demented and does not provide any history. Some of the history is provided by daughter present at bedside and ER provider's documentation was reviewed by myself. Apparently the patient was eating grilled cheese sandwich today and began choking he was sent to the ED for evaluation. Patient's daughter states that he had choking episodes recently as well but did not need hospitalization. ED Course: Patient was found to be hypoxic. Chest x-ray was negative for infiltrates but patient was started empirically on Zosyn. Hospitalist service was called to evaluate the patient.  Hospital Course: Discharge Diagnoses:  Active Problems:   Essential hypertension   CAD, NATIVE VESSEL   Dementia   Hypoxia   Diabetes ([place])   Acute hypoxic respiratory failure -Likely secondary to aspiration pneumonia/pneumonitis, required oxygen on admission. He was able to be weaned off to room air without difficulties.  Aspiration pneumonia -Started on Zosyn, received 3 days while  hospitalized. Afebrile. Will transition to Augmentin for 3 additional days. CKD stage III - Cr at baseline Dementia/dysphagia -Speech consulted, recommended dysphagia 1 diet, this would be recommended to continue on discharge. Re-evaluate after treatment Hypertension -Continue home regimen Diabetes mellitus type 2 -Continue home regimen Hyperlipidemia -Continue home medications Hypothyroidism -Continue Synthroid CAD -continue Aspirin, coreg, statin   Discharge Instructions   Allergies as of 05/22/2017   No Known Allergies     Medication List    TAKE these medications   acetaminophen 500 MG tablet Commonly known as:  TYLENOL Take 500 mg by mouth every 8 (eight) hours as needed for moderate pain.   amLODipine 2.5 MG tablet Commonly known as:  NORVASC Take 2.5 mg by mouth every morning.   amoxicillin-clavulanate 875-125 MG tablet Commonly known as:  AUGMENTIN Take 1 tablet by mouth 2 (two) times daily.   aspirin 81 MG chewable tablet Chew 81 mg by mouth every morning.   CALCIUM 600+D 600-400 MG-UNIT tablet Generic drug:  Calcium Carbonate-Vitamin D Take 1 tablet by mouth 2 (two) times daily.   carvedilol 12.5 MG tablet Commonly known as:  COREG Take 12.5 mg by mouth 2 (two) times daily.   ferrous sulfate 325 (65 FE) MG tablet Take 325 mg by mouth daily with breakfast.   haloperidol 0.5 MG tablet Commonly known as:  HALDOL Take 0.5 mg by mouth 2 (two) times daily.   hydrochlorothiazide 25 MG tablet Commonly known as:  HYDRODIURIL Take 25 mg by mouth every morning.   ICAPS AREDS 2 PO Take 2 capsules by mouth daily.   Insulin Lispro Prot & Lispro (75-25) 100 UNIT/ML Kwikpen Commonly known as:  HUMALOG 75/25 MIX Inject 16 Units into the skin 2 (  two) times daily.   ipratropium 0.06 % nasal spray Commonly known as:  ATROVENT Place 2 sprays into both nostrils 2 (two) times daily.   levothyroxine 88 MCG tablet Commonly known as:  SYNTHROID, LEVOTHROID Take 88 mcg  by mouth daily before breakfast.   loratadine 10 MG tablet Commonly known as:  CLARITIN Take 10 mg by mouth at bedtime.   sitaGLIPtin 100 MG tablet Commonly known as:  JANUVIA Take 100 mg by mouth every morning.   vitamin B-12 1000 MCG tablet Commonly known as:  CYANOCOBALAMIN Take 1,000 mcg by mouth every morning.   VYTORIN 10-10 MG tablet Generic drug:  ezetimibe-simvastatin Take 1 tablet by mouth at bedtime.       No Known Allergies  Consultations:  None   Procedures/Studies:  X-ray Chest Pa And Lateral  Result Date: 05/21/2017 CLINICAL DATA:  Shortness of Breath EXAM: CHEST  2 VIEW COMPARISON:  05/20/2017 FINDINGS: Cardiac shadow is within normal limits. The aorta again demonstrates calcific changes. No focal infiltrate or sizable effusion is seen. No acute bony abnormality is noted. IMPRESSION: No acute abnormality noted. No significant change from the prior exam. Aortic Atherosclerosis (ICD10-170.0) Electronically Signed   By: Inez Catalina M.D.   On: 05/21/2017 09:44   Dg Chest Port 1 View  Result Date: 05/20/2017 CLINICAL DATA:  83 y/o M; shortness of breath. Choked on food this afternoon, possible aspiration. EXAM: PORTABLE CHEST 1 VIEW COMPARISON:  10/20/2016 chest radiograph FINDINGS: Stable normal cardiac silhouette given projection and technique. Aortic atherosclerosis with calcification. No focal consolidation. No pleural effusion or pneumothorax. No acute osseous abnormality is evident. IMPRESSION: No focal consolidation or effusion. Stable cardiac silhouette. Aortic atherosclerosis. Electronically Signed   By: Kristine Garbe M.D.   On: 05/20/2017 14:51      Subjective: - non verbal, appears comfortable, smiling.  Discharge Exam: Vitals:   05/21/17 1959 05/22/17 0629  BP: (!) 140/57 (!) 155/74  Pulse: 69 65  Resp: 20 (!) 22  Temp: 98.2 F (36.8 C) 98.2 F (36.8 C)  SpO2: 94% 96%   Vitals:   05/21/17 0455 05/21/17 1315 05/21/17 1959 05/22/17  0629  BP: 130/60 (!) 147/66 (!) 140/57 (!) 155/74  Pulse: 66 66 69 65  Resp: 20 18 20  (!) 22  Temp: 98.1 F (36.7 C) 99 F (37.2 C) 98.2 F (36.8 C) 98.2 F (36.8 C)  TempSrc: Oral Oral Oral Oral  SpO2: 100% 96% 94% 96%  Weight:      Height:        General: Pt is alert, awake, not in acute distress Cardiovascular: RRR, S1/S2 +, no rubs, no gallops Respiratory: CTA bilaterally, no wheezing, no rhonchi  The results of significant diagnostics from this hospitalization (including imaging, microbiology, ancillary and laboratory) are listed below for reference.     Microbiology: Recent Results (from the past 240 hour(s))  Blood culture (routine x 2)     Status: None (Preliminary result)   Collection Time: 05/20/17  3:42 PM  Result Value Ref Range Status   Specimen Description BLOOD LEFT FOREARM  Final   Special Requests   Final    BOTTLES DRAWN AEROBIC AND ANAEROBIC Blood Culture adequate volume   Culture   Final    NO GROWTH < 24 HOURS Performed at [facility], [address], [place], [place] [address]    Report Status PENDING  Incomplete  Blood culture (routine x 2)     Status: None (Preliminary result)   Collection Time:  05/20/17  3:42 PM  Result Value Ref Range Status   Specimen Description BLOOD RIGHT HAND  Final   Special Requests IN PEDIATRIC BOTTLE Blood Culture adequate volume  Final   Culture   Final    NO GROWTH < 24 HOURS Performed at [facility], [place] [address] [place], [place] [address]    Report Status PENDING  Incomplete  MRSA PCR Screening     Status: Abnormal   Collection Time: 05/21/17  7:50 AM  Result Value Ref Range Status   MRSA by PCR POSITIVE (A) NEGATIVE Final    Comment:        The GeneXpert MRSA Assay (FDA approved for NASAL specimens only), is one component of a comprehensive MRSA colonization surveillance program. It is not intended to diagnose MRSA infection nor to guide or monitor treatment for MRSA infections. RESULT  CALLED TO, READ BACK BY AND VERIFIED WITH: KAUR,B @ 1506 ON [id number] BY POTEAT,S      Labs: BNP (last 3 results) No results for input(s): BNP in the last 8760 hours. Basic Metabolic Panel:  Recent Labs Lab 05/20/17 1542 05/21/17 0708  NA 149* 146*  K 3.7 3.1*  CL 113* 112*  CO2 27 26  GLUCOSE 124* 146*  BUN 40* 35*  CREATININE 1.69* 1.67*  CALCIUM 8.9 8.5*   Liver Function Tests: No results for input(s): AST, ALT, ALKPHOS, BILITOT, PROT, ALBUMIN in the last 168 hours. No results for input(s): LIPASE, AMYLASE in the last 168 hours. No results for input(s): AMMONIA in the last 168 hours. CBC:  Recent Labs Lab 05/20/17 1542 05/21/17 0708  WBC 8.2 9.9  NEUTROABS 6.3  --   HGB 10.1* 9.2*  HCT 31.5* 28.6*  MCV 94.0 91.4  PLT 135* 124*   Cardiac Enzymes: No results for input(s): CKTOTAL, CKMB, CKMBINDEX, TROPONINI in the last 168 hours. BNP: Invalid input(s): POCBNP CBG:  Recent Labs Lab 05/21/17 0742 05/21/17 1206 05/21/17 1702 05/21/17 2002 05/22/17 0758  GLUCAP 148* 142* 211* 156* 120*   D-Dimer No results for input(s): DDIMER in the last 72 hours. Hgb A1c No results for input(s): HGBA1C in the last 72 hours. Lipid Profile No results for input(s): CHOL, HDL, LDLCALC, TRIG, CHOLHDL, LDLDIRECT in the last 72 hours. Thyroid function studies No results for input(s): TSH, T4TOTAL, T3FREE, THYROIDAB in the last 72 hours.  Invalid input(s): FREET3 Anemia work up No results for input(s): VITAMINB12, FOLATE, FERRITIN, TIBC, IRON, RETICCTPCT in the last 72 hours. Urinalysis    Component Value Date/Time   COLORURINE YELLOW 10/20/2016 2200   APPEARANCEUR CLEAR 10/20/2016 2200   LABSPEC 1.015 10/20/2016 2200   PHURINE 6.5 10/20/2016 2200   GLUCOSEU NEGATIVE 10/20/2016 2200   HGBUR NEGATIVE 10/20/2016 2200   BILIRUBINUR NEGATIVE 10/20/2016 2200   KETONESUR NEGATIVE 10/20/2016 2200   PROTEINUR 30 (A) 10/20/2016 2200   UROBILINOGEN 0.2 12/08/2013 1259    NITRITE NEGATIVE 10/20/2016 2200   LEUKOCYTESUR NEGATIVE 10/20/2016 2200   Sepsis Labs Invalid input(s): PROCALCITONIN,  WBC,  LACTICIDVEN Microbiology Recent Results (from the past 240 hour(s))  Blood culture (routine x 2)     Status: None (Preliminary result)   Collection Time: 05/20/17  3:42 PM  Result Value Ref Range Status   Specimen Description BLOOD LEFT FOREARM  Final   Special Requests   Final    BOTTLES DRAWN AEROBIC AND ANAEROBIC Blood Culture adequate volume   Culture   Final    NO GROWTH < 24 HOURS Performed at [facility], [place] [address] [place], [place] [address]    Report Status PENDING  Incomplete  Blood culture (routine x 2)     Status: None (Preliminary result)   Collection Time: 05/20/17  3:42 PM  Result Value Ref Range Status   Specimen Description BLOOD RIGHT HAND  Final   Special Requests IN PEDIATRIC BOTTLE Blood Culture adequate volume  Final   Culture   Final    NO GROWTH < 24 HOURS Performed at [facility], [place] [address], [place], [place] [address]    Report Status PENDING  Incomplete  MRSA PCR Screening     Status: Abnormal   Collection Time: 05/21/17  7:50 AM  Result Value Ref Range Status   MRSA by PCR POSITIVE (A) NEGATIVE Final    Comment:        The GeneXpert MRSA Assay (FDA approved for NASAL specimens only), is one component of a comprehensive MRSA colonization surveillance program. It is not intended to diagnose MRSA infection nor to guide or monitor treatment for MRSA infections. RESULT CALLED TO, READ BACK BY AND VERIFIED WITH: KAUR,B @ 0352 ON [id number] BY POTEAT,S      Time coordinating discharge: 40 minutes  SIGNED:  Marzetta Board, MD  [facility] 05/22/2017, 10:40 AM Pager [phone]  If 7PM-7AM, please contact night-coverage [email or web address] Password [email or web address]

## 2017-05-22 NOTE — NC FL2 (Addendum)
[place] LEVEL OF CARE SCREENING TOOL     IDENTIFICATION  Patient Name: [name] Birthdate: [birth year] Sex: male Admission Date (Current Location): 05/20/2017  [facility] and [place] Number:  Herbalist and Address:  [facility],  [place] [address], [place]      Provider Number: [id number]  Attending Physician Name and Address:  Caren Griffins, MD  Relative Name and Phone Number:       Current Level of Care: Hospital Recommended Level of Care: [place] Prior Approval Number:    Date Approved/Denied:   PASRR Number:    Discharge Plan:      Current Diagnoses: Patient Active Problem List   Diagnosis Date Noted  . Hypoxia 05/20/2017  . Diabetes ([place]) 05/20/2017  . Dementia 12/17/2010  . FULL INCONTINENCE OF FECES 12/17/2010  . DIARRHEA 12/17/2010  . CAD, NATIVE VESSEL 11/20/2010  . Essential hypertension 11/19/2010    Orientation RESPIRATION BLADDER Height & Weight     Self  Normal External catheter, Incontinent Weight: 170 lb 6.7 oz (77.3 kg) Height:  5\' 5"  (165.1 cm)  BEHAVIORAL SYMPTOMS/MOOD NEUROLOGICAL BOWEL NUTRITION STATUS      Incontinent Diet recommendations: Dysphagia 1 (puree);Thin liquid  AMBULATORY STATUS COMMUNICATION OF NEEDS Skin   Extensive Assist Verbally Normal                       Personal Care Assistance Level of Assistance  Bathing, Feeding, Dressing Bathing Assistance: Maximum assistance Feeding assistance: Limited assistance Dressing Assistance: Maximum assistance     Functional Limitations Info  Sight, Hearing, Speech Sight Info: Adequate Hearing Info: Impaired      SPECIAL CARE FACTORS FREQUENCY  Speech therapy             Speech Therapy Frequency: min 1 x/week      Contractures Contractures Info: Present    Additional Factors Info  Code Status, Allergies Code Status Info: DNR Allergies Info: No Known Allergies           Current Medications  (05/22/2017):  This is the current hospital active medication list Current Facility-Administered Medications  Medication Dose Route Frequency Provider Last Rate Last Dose  . acetaminophen (TYLENOL) tablet 500 mg  500 mg Oral Q8H PRN Aline August, MD   500 mg at 05/21/17 2300  . amLODipine (NORVASC) tablet 2.5 mg  2.5 mg Oral q morning - 10a Alekh, Kshitiz, MD      . aspirin chewable tablet 81 mg  81 mg Oral q morning - 10a Alekh, Kshitiz, MD      . carvedilol (COREG) tablet 12.5 mg  12.5 mg Oral BID WC Starla Link, Kshitiz, MD   12.5 mg at 05/22/17 0826  . Chlorhexidine Gluconate Cloth 2 % PADS 6 each  6 each Topical Q0600 Caren Griffins, MD      . enoxaparin (LOVENOX) injection 30 mg  30 mg Subcutaneous Q24H Starla Link, Kshitiz, MD   30 mg at 05/21/17 2149  . ezetimibe (ZETIA) tablet 10 mg  10 mg Oral QHS Alekh, Kshitiz, MD   10 mg at 05/21/17 2300   And  . simvastatin (ZOCOR) tablet 10 mg  10 mg Oral QHS Alekh, Kshitiz, MD   10 mg at 05/21/17 2150  . haloperidol (HALDOL) tablet 0.5 mg  0.5 mg Oral BID Aline August, MD   0.5 mg at 05/21/17 1856  . hydrochlorothiazide (HYDRODIURIL) tablet 25 mg  25 mg Oral q morning - 10a  Aline August, MD      . insulin aspart (novoLOG) injection 0-15 Units  0-15 Units Subcutaneous TID WC Aline August, MD   5 Units at 05/21/17 1823  . insulin aspart (novoLOG) injection 0-5 Units  0-5 Units Subcutaneous QHS Alekh, Kshitiz, MD      . insulin lispro protamine-lispro (HUMALOG 75/25 MIX) injection 16 Units  16 Units Subcutaneous BID WC Aline August, MD   16 Units at 05/22/17 0826  . ipratropium (ATROVENT) 0.06 % nasal spray 2 spray  2 spray Each Nare BID Aline August, MD   2 spray at 05/20/17 2119  . lactated ringers infusion   Intravenous Continuous Aline August, MD 75 mL/hr at 05/22/17 0500    . levothyroxine (SYNTHROID, LEVOTHROID) tablet 88 mcg  88 mcg Oral QAC breakfast Aline August, MD   88 mcg at 05/22/17 0829  . mupirocin ointment (BACTROBAN) 2 % 1  application  1 application Nasal BID Caren Griffins, MD   1 application at 23/36/12 2200  . ondansetron (ZOFRAN) tablet 4 mg  4 mg Oral Q6H PRN Aline August, MD       Or  . ondansetron (ZOFRAN) injection 4 mg  4 mg Intravenous Q6H PRN Alekh, Kshitiz, MD      . piperacillin-tazobactam (ZOSYN) IVPB 3.375 g  3.375 g Intravenous Q8H Emiliano Dyer, RPH 12.5 mL/hr at 05/22/17 0826 3.375 g at 05/22/17 0826  . RESOURCE THICKENUP CLEAR   Oral PRN Caren Griffins, MD      . vitamin B-12 (CYANOCOBALAMIN) tablet 1,000 mcg  1,000 mcg Oral q morning - 10a Aline August, MD         Discharge Medications: Please see discharge summary for a list of discharge medications.  Relevant Imaging Results:  Relevant Lab Results:   Additional Information SSN:[id number]  Lia Hopping, LCSW

## 2017-05-22 NOTE — Progress Notes (Signed)
Called report to [facility]. Gave report to RN.

## 2017-05-22 NOTE — Progress Notes (Signed)
Date: May 22, 2017 Chart reviewed for discharge orders: None found for case management. Vernia Buff, [phone]

## 2017-05-22 NOTE — Progress Notes (Signed)
Brief support with family around pt discharge and transfer back to facility.  Family complimentary of care at [facility].   WL / [place], MDiv

## 2017-05-22 NOTE — Clinical Social Work Note (Addendum)
Clinical Social Work Assessment  Patient Details  Name: [name] MRN: [id number] Date of Birth: 12-21-1919  Date of referral:  05/21/17               Reason for consult:  Facility Placement                Permission sought to share information with:  Family Supports Permission granted to share information::     Name::        Agency::     Relationship::  Daughter   Contact Information:     Housing/Transportation Living arrangements for the past 2 months:  [place] of Information:  Adult Children, Facility Patient Interpreter Needed:  None Criminal Activity/Legal Involvement Pertinent to Current Situation/Hospitalization:  No - Comment as needed Significant Relationships:  Adult Children Lives with:  Facility Resident Do you feel safe going back to the place where you live?    Need for family participation in patient care:  No (Coment)  Care giving concerns:  [place].    Social Worker assessment / plan:  CSW spoke with patient daughter about care and pt. plan at discharge. She reports the patient needs two assist, needs assistance with ADL'S and uses fingers to feed self. She reports the patient had previous choking experience at facility and was given abdominal thrusts to help remove food. This episode the facility wanted the patient to be checked out. The patient was seen by speech and recommended Dysphagia one diet. At discharge the plan is for patient to continue care at ALF.  CSW confirmed with Devonia at facility patient is able to return.   Plan: Return to [place]  Employment status:    Insurance information:  Medicare PT Recommendations:  Not assessed at this time Information / Referral to community resources:  [place]  Patient/Family's Response to care: Agreeable and responding well to care.   Patient/Family's Understanding of and Emotional Response to Diagnosis, Current Treatment, and Prognosis:  Daughter  very involved in care, she request to be updated about care. She wants to be present when patient is transported by to ALF at discharge.   Emotional Assessment Appearance:    Attitude/Demeanor/Rapport:  Unable to Assess Affect (typically observed):  Unable to Assess Orientation:  Oriented to Self Alcohol / Substance use:  Not Applicable Psych involvement (Current and /or in the community):  No (Comment)  Discharge Needs  Concerns to be addressed:    Readmission within the last 30 days:  Yes Current discharge risk:  None Barriers to Discharge:  Continued Medical Work up   [employer], LCSW 05/22/2017, 8:42 AM

## 2017-05-25 LAB — CULTURE, BLOOD (ROUTINE X 2)
CULTURE: NO GROWTH
CULTURE: NO GROWTH
SPECIAL REQUESTS: ADEQUATE
Special Requests: ADEQUATE

## 2017-05-30 DIAGNOSIS — R1312 Dysphagia, oropharyngeal phase: Secondary | ICD-10-CM | POA: Diagnosis not present

## 2017-06-03 DIAGNOSIS — R6889 Other general symptoms and signs: Secondary | ICD-10-CM | POA: Diagnosis not present

## 2017-06-03 DIAGNOSIS — R7989 Other specified abnormal findings of blood chemistry: Secondary | ICD-10-CM | POA: Diagnosis not present

## 2017-06-04 DIAGNOSIS — R1312 Dysphagia, oropharyngeal phase: Secondary | ICD-10-CM | POA: Diagnosis not present

## 2017-06-08 ENCOUNTER — Emergency Department (HOSPITAL_COMMUNITY)
Admission: EM | Admit: 2017-06-08 | Discharge: 2017-06-08 | Disposition: A | Discharge status: Home / Self Care | Payer: Medicare Other | Attending: Emergency Medicine | Admitting: Emergency Medicine

## 2017-06-08 ENCOUNTER — Encounter (HOSPITAL_COMMUNITY): Payer: Self-pay | Admitting: Emergency Medicine

## 2017-06-08 ENCOUNTER — Emergency Department (HOSPITAL_COMMUNITY): Payer: Medicare Other

## 2017-06-08 DIAGNOSIS — I2510 Atherosclerotic heart disease of native coronary artery without angina pectoris: Secondary | ICD-10-CM | POA: Diagnosis not present

## 2017-06-08 DIAGNOSIS — Z7982 Long term (current) use of aspirin: Secondary | ICD-10-CM | POA: Insufficient documentation

## 2017-06-08 DIAGNOSIS — Z8546 Personal history of malignant neoplasm of prostate: Secondary | ICD-10-CM | POA: Insufficient documentation

## 2017-06-08 DIAGNOSIS — I10 Essential (primary) hypertension: Secondary | ICD-10-CM | POA: Diagnosis not present

## 2017-06-08 DIAGNOSIS — Y92129 Unspecified place in nursing home as the place of occurrence of the external cause: Secondary | ICD-10-CM | POA: Diagnosis not present

## 2017-06-08 DIAGNOSIS — Y999 Unspecified external cause status: Secondary | ICD-10-CM | POA: Insufficient documentation

## 2017-06-08 DIAGNOSIS — E119 Type 2 diabetes mellitus without complications: Secondary | ICD-10-CM | POA: Insufficient documentation

## 2017-06-08 DIAGNOSIS — W19XXXA Unspecified fall, initial encounter: Secondary | ICD-10-CM | POA: Diagnosis not present

## 2017-06-08 DIAGNOSIS — S61412A Laceration without foreign body of left hand, initial encounter: Secondary | ICD-10-CM | POA: Diagnosis not present

## 2017-06-08 DIAGNOSIS — R4182 Altered mental status, unspecified: Secondary | ICD-10-CM | POA: Diagnosis not present

## 2017-06-08 DIAGNOSIS — Y939 Activity, unspecified: Secondary | ICD-10-CM | POA: Insufficient documentation

## 2017-06-08 DIAGNOSIS — T148XXA Other injury of unspecified body region, initial encounter: Secondary | ICD-10-CM

## 2017-06-08 DIAGNOSIS — Z794 Long term (current) use of insulin: Secondary | ICD-10-CM | POA: Insufficient documentation

## 2017-06-08 DIAGNOSIS — Z79899 Other long term (current) drug therapy: Secondary | ICD-10-CM | POA: Diagnosis not present

## 2017-06-08 DIAGNOSIS — S0091XA Abrasion of unspecified part of head, initial encounter: Secondary | ICD-10-CM | POA: Diagnosis not present

## 2017-06-08 DIAGNOSIS — S60512A Abrasion of left hand, initial encounter: Secondary | ICD-10-CM | POA: Diagnosis not present

## 2017-06-08 DIAGNOSIS — S0990XA Unspecified injury of head, initial encounter: Secondary | ICD-10-CM | POA: Diagnosis not present

## 2017-06-08 DIAGNOSIS — S0081XA Abrasion of other part of head, initial encounter: Secondary | ICD-10-CM | POA: Insufficient documentation

## 2017-06-08 HISTORY — DX: Disorder of thyroid, unspecified: E07.9

## 2017-06-08 HISTORY — DX: Aphasia: R47.01

## 2017-06-08 NOTE — ED Notes (Signed)
Bed: NB39 Expected date: 06/08/17 Expected time:  Means of arrival:  Comments: 89, M, fall, dememted

## 2017-06-08 NOTE — ED Provider Notes (Signed)
[facility] DEPT Provider Note   CSN: [id number] Arrival date & time: 06/08/17  1240     History   Chief Complaint Chief Complaint  Patient presents with  . Fall    HPI [name] is a 83 y.o. male.   Fall  This is a recurrent problem. The problem occurs constantly. The problem has not changed since onset.Nothing aggravates the symptoms. Nothing relieves the symptoms. He has tried nothing for the symptoms.    Past Medical History:  Diagnosis Date  . Anemia   . Aphasia   . CAD (coronary artery disease)   . Dementia   . Diabetes ([place]) 05/20/2017  . DM (diabetes mellitus) ([place])   . Gout   . Hyperlipidemia   . Hypertension   . Prostate cancer ([place])   . Thyroid disease     Patient Active Problem List   Diagnosis Date Noted  . Acute respiratory failure with hypoxia ([place])   . SOB (shortness of breath)   . Hypoxia 05/20/2017  . Diabetes ([place]) 05/20/2017  . Dementia 12/17/2010  . FULL INCONTINENCE OF FECES 12/17/2010  . DIARRHEA 12/17/2010  . CAD, NATIVE VESSEL 11/20/2010  . Essential hypertension 11/19/2010    Past Surgical History:  Procedure Laterality Date  . CATARACT EXTRACTION  2000  . [place] Medications    Prior to Admission medications   Medication Sig Start Date End Date Taking? Authorizing Provider  acetaminophen (TYLENOL) 500 MG tablet Take 500 mg by mouth every 8 (eight) hours as needed for moderate pain.   Yes [provider]  amLODipine (NORVASC) 2.5 MG tablet Take 2.5 mg by mouth every morning.   Yes [provider]  aspirin 81 MG chewable tablet Chew 81 mg by mouth every morning.   Yes [provider]  Calcium Carbonate-Vitamin D (CALCIUM 600+D) 600-400 MG-UNIT tablet Take 1 tablet by mouth 2 (two) times daily.   Yes [provider]  carvedilol (COREG) 12.5 MG tablet Take 12.5 mg by mouth 2 (two) times daily.    Yes [provider]  ezetimibe-simvastatin (VYTORIN) 10-10  MG per tablet Take 1 tablet by mouth at bedtime.     Yes [provider]  ferrous sulfate 325 (65 FE) MG tablet Take 325 mg by mouth daily with breakfast.   Yes [provider]  haloperidol (HALDOL) 0.5 MG tablet Take 0.5 mg by mouth 2 (two) times daily.    Yes [provider]  hydrochlorothiazide (HYDRODIURIL) 25 MG tablet Take 25 mg by mouth every morning.   Yes [provider]  Insulin Lispro Prot & Lispro (HUMALOG 75/25 MIX) (75-25) 100 UNIT/ML Kwikpen Inject 16 Units into the skin 2 (two) times daily.    Yes [provider]  ipratropium (ATROVENT) 0.06 % nasal spray Place 2 sprays into both nostrils 2 (two) times daily.   Yes [provider]  levothyroxine (SYNTHROID, LEVOTHROID) 88 MCG tablet Take 88 mcg by mouth daily before breakfast.   Yes [provider]  loratadine (CLARITIN) 10 MG tablet Take 10 mg by mouth at bedtime.   Yes [provider]  Multiple Vitamins-Minerals (ICAPS AREDS 2 PO) Take 2 capsules by mouth daily.   Yes [provider]  nystatin cream (MYCOSTATIN) Apply 1 application topically 2 (two) times daily.   Yes [provider]  vitamin B-12 (CYANOCOBALAMIN) 1000 MCG tablet Take 1,000 mcg by mouth every morning.    Yes [provider]  Family History Family History  Problem Relation Age of Onset  . Heart disease Brother   . Diabetes Brother   . Hypertension Mother   . Colon cancer Neg Hx     Social History Social History  Substance Use Topics  . Smoking status: Never Smoker  . Smokeless tobacco: Never Used  . Alcohol use No     Allergies   Patient has no known allergies.   Review of Systems Review of Systems  Unable to perform ROS: Dementia  Respiratory: Negative for apnea.   Skin: Positive for wound.     Physical Exam Updated Vital Signs BP (!) 167/72   Pulse 65   Temp 98.1 F (36.7 C) (Oral)   Resp 16   SpO2 99%   Physical Exam    Constitutional: He appears well-developed and well-nourished.  HENT:  Head: Normocephalic and atraumatic.  Eyes: Conjunctivae and EOM are normal.  Neck: Normal range of motion.  Cardiovascular: Normal rate.   Pulmonary/Chest: Effort normal. No respiratory distress.  Abdominal: Soft. He exhibits no distension.  Musculoskeletal: Normal range of motion.  No cervical spine tenderness, thoracic spine tenderness or Lumbar spine tenderness.  No tenderness or pain with palpation and full ROM of all joints in upper and lower extremities.  No ecchymosis or other signs of trauma on back or extremities.  No Pain with AP or lateral compression of ribs.  No Paracervical ttp, paraspinal ttp   Neurological: He is alert.  Skin:  Abrasion to top of scalp  Nursing note and vitals reviewed.    ED Treatments / Results  Labs (all labs ordered are listed, but only abnormal results are displayed) Labs Reviewed - No data to display  EKG  EKG Interpretation None       Radiology No results found.  Procedures Procedures (including critical care time)  Medications Ordered in ED Medications - No data to display   Initial Impression / Assessment and Plan / ED Course  I have reviewed the triage vital signs and the nursing notes.  Pertinent labs & imaging results that were available during my care of the patient were reviewed by me and considered in my medical decision making (see chart for details).     CT negative. No other indication for further imaging. Family ok with workup. Will dc to facility.   Final Clinical Impressions(s) / ED Diagnoses   Final diagnoses:  Fall, initial encounter  Abrasion  Skin tear of left hand without complication, initial encounter    New Prescriptions Discharge Medication List as of 06/08/2017  3:42 PM       Baudelio Karnes, Corene Cornea, MD 06/10/17 5784

## 2017-06-08 NOTE — ED Triage Notes (Signed)
Per EMS. Pt from [facility] facility. Pt fell forward out of wheelchair. Pt has abrasion on top of head and a skin tear to his L wrist that has been bandaged by EMS. Pt has hx of dementia. Oriented per norm according to family (non verbal, minimally interactive).

## 2017-06-08 NOTE — ED Notes (Signed)
Patient transported to CT 

## 2017-06-11 DIAGNOSIS — E039 Hypothyroidism, unspecified: Secondary | ICD-10-CM | POA: Diagnosis not present

## 2017-06-11 DIAGNOSIS — D649 Anemia, unspecified: Secondary | ICD-10-CM | POA: Diagnosis not present

## 2017-06-11 DIAGNOSIS — Z0000 Encounter for general adult medical examination without abnormal findings: Secondary | ICD-10-CM | POA: Diagnosis not present

## 2017-06-11 DIAGNOSIS — F0390 Unspecified dementia without behavioral disturbance: Secondary | ICD-10-CM | POA: Diagnosis not present

## 2017-06-11 DIAGNOSIS — N183 Chronic kidney disease, stage 3 (moderate): Secondary | ICD-10-CM | POA: Diagnosis not present

## 2017-06-11 DIAGNOSIS — Z7409 Other reduced mobility: Secondary | ICD-10-CM | POA: Diagnosis not present

## 2017-06-11 DIAGNOSIS — Z794 Long term (current) use of insulin: Secondary | ICD-10-CM | POA: Diagnosis not present

## 2017-06-11 DIAGNOSIS — I10 Essential (primary) hypertension: Secondary | ICD-10-CM | POA: Diagnosis not present

## 2017-06-11 DIAGNOSIS — E1122 Type 2 diabetes mellitus with diabetic chronic kidney disease: Secondary | ICD-10-CM | POA: Diagnosis not present

## 2017-06-11 DIAGNOSIS — E782 Mixed hyperlipidemia: Secondary | ICD-10-CM | POA: Diagnosis not present

## 2017-08-05 DIAGNOSIS — R531 Weakness: Secondary | ICD-10-CM | POA: Diagnosis not present

## 2017-08-09 DIAGNOSIS — Z23 Encounter for immunization: Secondary | ICD-10-CM | POA: Diagnosis not present

## 2017-09-08 DIAGNOSIS — E1122 Type 2 diabetes mellitus with diabetic chronic kidney disease: Secondary | ICD-10-CM | POA: Diagnosis not present

## 2017-09-08 DIAGNOSIS — Z794 Long term (current) use of insulin: Secondary | ICD-10-CM | POA: Diagnosis not present

## 2017-09-08 DIAGNOSIS — R739 Hyperglycemia, unspecified: Secondary | ICD-10-CM | POA: Diagnosis not present

## 2017-09-10 DIAGNOSIS — R531 Weakness: Secondary | ICD-10-CM | POA: Diagnosis not present

## 2017-09-15 DIAGNOSIS — R531 Weakness: Secondary | ICD-10-CM | POA: Diagnosis not present

## 2017-09-25 DIAGNOSIS — Z79899 Other long term (current) drug therapy: Secondary | ICD-10-CM | POA: Diagnosis not present

## 2017-10-16 ENCOUNTER — Emergency Department (HOSPITAL_COMMUNITY)
Admission: EM | Admit: 2017-10-16 | Discharge: 2017-10-16 | Disposition: A | Discharge status: Home / Self Care | Payer: Medicare Other | Attending: Emergency Medicine | Admitting: Emergency Medicine

## 2017-10-16 ENCOUNTER — Encounter (HOSPITAL_COMMUNITY): Payer: Self-pay | Admitting: Emergency Medicine

## 2017-10-16 DIAGNOSIS — Z7982 Long term (current) use of aspirin: Secondary | ICD-10-CM | POA: Insufficient documentation

## 2017-10-16 DIAGNOSIS — Z79899 Other long term (current) drug therapy: Secondary | ICD-10-CM | POA: Insufficient documentation

## 2017-10-16 DIAGNOSIS — Z794 Long term (current) use of insulin: Secondary | ICD-10-CM | POA: Diagnosis not present

## 2017-10-16 DIAGNOSIS — E119 Type 2 diabetes mellitus without complications: Secondary | ICD-10-CM | POA: Insufficient documentation

## 2017-10-16 DIAGNOSIS — Z8546 Personal history of malignant neoplasm of prostate: Secondary | ICD-10-CM | POA: Diagnosis not present

## 2017-10-16 DIAGNOSIS — E162 Hypoglycemia, unspecified: Secondary | ICD-10-CM | POA: Insufficient documentation

## 2017-10-16 DIAGNOSIS — I10 Essential (primary) hypertension: Secondary | ICD-10-CM | POA: Diagnosis not present

## 2017-10-16 DIAGNOSIS — I2510 Atherosclerotic heart disease of native coronary artery without angina pectoris: Secondary | ICD-10-CM | POA: Diagnosis not present

## 2017-10-16 DIAGNOSIS — R402441 Other coma, without documented Glasgow coma scale score, or with partial score reported, in the field [EMT or ambulance]: Secondary | ICD-10-CM | POA: Diagnosis not present

## 2017-10-16 DIAGNOSIS — R2681 Unsteadiness on feet: Secondary | ICD-10-CM | POA: Diagnosis not present

## 2017-10-16 DIAGNOSIS — R4182 Altered mental status, unspecified: Secondary | ICD-10-CM | POA: Diagnosis not present

## 2017-10-16 DIAGNOSIS — R262 Difficulty in walking, not elsewhere classified: Secondary | ICD-10-CM | POA: Diagnosis not present

## 2017-10-16 DIAGNOSIS — F0390 Unspecified dementia without behavioral disturbance: Secondary | ICD-10-CM | POA: Diagnosis not present

## 2017-10-16 DIAGNOSIS — E10649 Type 1 diabetes mellitus with hypoglycemia without coma: Secondary | ICD-10-CM | POA: Diagnosis not present

## 2017-10-16 DIAGNOSIS — R4020 Unspecified coma: Secondary | ICD-10-CM | POA: Diagnosis present

## 2017-10-16 DIAGNOSIS — E11649 Type 2 diabetes mellitus with hypoglycemia without coma: Secondary | ICD-10-CM | POA: Diagnosis not present

## 2017-10-16 LAB — URINALYSIS, ROUTINE W REFLEX MICROSCOPIC
Bilirubin Urine: NEGATIVE
Glucose, UA: NEGATIVE mg/dL
Hgb urine dipstick: NEGATIVE
Ketones, ur: NEGATIVE mg/dL
LEUKOCYTES UA: NEGATIVE
NITRITE: NEGATIVE
Protein, ur: NEGATIVE mg/dL
SPECIFIC GRAVITY, URINE: 1.016 (ref 1.005–1.030)
pH: 6 (ref 5.0–8.0)

## 2017-10-16 LAB — CBC WITH DIFFERENTIAL/PLATELET
BASOS ABS: 0 10*3/uL (ref 0.0–0.1)
BASOS PCT: 0 %
Eosinophils Absolute: 0.1 10*3/uL (ref 0.0–0.7)
Eosinophils Relative: 2 %
HCT: 30.1 % — ABNORMAL LOW (ref 39.0–52.0)
HEMOGLOBIN: 9.7 g/dL — AB (ref 13.0–17.0)
LYMPHS PCT: 17 %
Lymphs Abs: 0.9 10*3/uL (ref 0.7–4.0)
MCH: 30.1 pg (ref 26.0–34.0)
MCHC: 32.2 g/dL (ref 30.0–36.0)
MCV: 93.5 fL (ref 78.0–100.0)
MONOS PCT: 6 %
Monocytes Absolute: 0.3 10*3/uL (ref 0.1–1.0)
NEUTROS ABS: 4 10*3/uL (ref 1.7–7.7)
NEUTROS PCT: 75 %
Platelets: 122 10*3/uL — ABNORMAL LOW (ref 150–400)
RBC: 3.22 MIL/uL — ABNORMAL LOW (ref 4.22–5.81)
RDW: 15 % (ref 11.5–15.5)
WBC: 5.2 10*3/uL (ref 4.0–10.5)

## 2017-10-16 LAB — BASIC METABOLIC PANEL
ANION GAP: 8 (ref 5–15)
BUN: 39 mg/dL — ABNORMAL HIGH (ref 6–20)
CHLORIDE: 106 mmol/L (ref 101–111)
CO2: 23 mmol/L (ref 22–32)
Calcium: 7.8 mg/dL — ABNORMAL LOW (ref 8.9–10.3)
Creatinine, Ser: 1.79 mg/dL — ABNORMAL HIGH (ref 0.61–1.24)
GFR calc non Af Amer: 31 mL/min — ABNORMAL LOW (ref >60)
GFR, EST AFRICAN AMERICAN: 35 mL/min — AB (ref >60)
Glucose, Bld: 81 mg/dL (ref 65–99)
Potassium: 3.1 mmol/L — ABNORMAL LOW (ref 3.5–5.1)
Sodium: 137 mmol/L (ref 135–145)

## 2017-10-16 LAB — CBG MONITORING, ED
GLUCOSE-CAPILLARY: 73 mg/dL (ref 65–99)
GLUCOSE-CAPILLARY: 117 mg/dL — AB (ref 65–99)
Glucose-Capillary: 121 mg/dL — ABNORMAL HIGH (ref 65–99)
Glucose-Capillary: 87 mg/dL (ref 65–99)
Glucose-Capillary: 132 mg/dL — ABNORMAL HIGH (ref 65–99)

## 2017-10-16 NOTE — ED Provider Notes (Signed)
CHIEF COMPLAINT: Hypoglycemia  HPI: Patient is a 84 year old male who presents to the emergency department with EMS from [facility] living with complaints of hypoglycemia.  Was found unresponsive and diaphoretic.  Patient's blood glucose was found to be 35 at the nursing facility.  Was given 12 g of D10 by EMS.  Blood sugar here is normal.  He is on Januvia and Humalog.  Family reports he is demented and mostly nonverbal at baseline.  He does, drink thickened liquids.  ROS: Level 5 caveat for dementia  PAST MEDICAL HISTORY/PAST SURGICAL HISTORY:  Past Medical History:  Diagnosis Date  . Anemia   . Aphasia   . CAD (coronary artery disease)   . Dementia   . Diabetes ([place]) 05/20/2017  . DM (diabetes mellitus) ([place])   . Gout   . Hyperlipidemia   . Hypertension   . Prostate cancer ([place])   . Thyroid disease     MEDICATIONS:  Prior to Admission medications   Medication Sig Start Date End Date Taking? Authorizing Provider  acetaminophen (TYLENOL) 500 MG tablet Take 500 mg by mouth every 8 (eight) hours as needed for moderate pain.    [provider]  amLODipine (NORVASC) 2.5 MG tablet Take 2.5 mg by mouth every morning.    [provider]  aspirin 81 MG chewable tablet Chew 81 mg by mouth every morning.    [provider]  Calcium Carbonate-Vitamin D (CALCIUM 600+D) 600-400 MG-UNIT tablet Take 1 tablet by mouth 2 (two) times daily.    [provider]  carvedilol (COREG) 12.5 MG tablet Take 12.5 mg by mouth 2 (two) times daily.     [provider]  ezetimibe-simvastatin (VYTORIN) 10-10 MG per tablet Take 1 tablet by mouth at bedtime.      [provider]  ferrous sulfate 325 (65 FE) MG tablet Take 325 mg by mouth daily with breakfast.    [provider]  haloperidol (HALDOL) 0.5 MG tablet Take 0.5 mg by mouth 2 (two) times daily.     [provider]  hydrochlorothiazide (HYDRODIURIL) 25 MG tablet Take 25 mg by mouth  every morning.    [provider]  Insulin Lispro Prot & Lispro (HUMALOG 75/25 MIX) (75-25) 100 UNIT/ML Kwikpen Inject 16 Units into the skin 2 (two) times daily.     [provider]  ipratropium (ATROVENT) 0.06 % nasal spray Place 2 sprays into both nostrils 2 (two) times daily.    [provider]  levothyroxine (SYNTHROID, LEVOTHROID) 88 MCG tablet Take 88 mcg by mouth daily before breakfast.    [provider]  loratadine (CLARITIN) 10 MG tablet Take 10 mg by mouth at bedtime.    [provider]  Multiple Vitamins-Minerals (ICAPS AREDS 2 PO) Take 2 capsules by mouth daily.    [provider]  nystatin cream (MYCOSTATIN) Apply 1 application topically 2 (two) times daily.    [provider]  vitamin B-12 (CYANOCOBALAMIN) 1000 MCG tablet Take 1,000 mcg by mouth every morning.     [provider]    ALLERGIES:  No Known Allergies  SOCIAL HISTORY:  Social History   Tobacco Use  . Smoking status: Never Smoker  . Smokeless tobacco: Never Used  Substance Use Topics  . Alcohol use: No    FAMILY HISTORY: Family History  Problem Relation Age of Onset  . Heart disease Brother   . Diabetes Brother   . Hypertension Mother   . Colon cancer Neg Hx  EXAM: BP 131/69   Pulse 60   Temp 97.9 F (36.6 C) (Oral)   Resp 16   SpO2 96%  CONSTITUTIONAL: Alert.  Patient is elderly.  He does not answer questions but will state "yes" intermittently.  Appears to be in no distress.  Afebrile. HEAD: Normocephalic EYES: Conjunctivae clear, pupils appear equal, EOMI ENT: normal nose; moist mucous membranes NECK: Supple, no meningismus, no nuchal rigidity, no LAD  CARD: RRR; S1 and S2 appreciated; no murmurs, no clicks, no rubs, no gallops RESP: Normal chest excursion without splinting or tachypnea; breath sounds clear and equal bilaterally; no wheezes, no rhonchi, no rales, no hypoxia or respiratory distress, speaking full  sentences ABD/GI: Normal bowel sounds; non-distended; soft, non-tender, no rebound, no guarding, no peritoneal signs, no hepatosplenomegaly BACK:  The back appears normal and is non-tender to palpation, there is no CVA tenderness EXT: Normal ROM in all joints; non-tender to palpation; no edema; normal capillary refill; no cyanosis, no calf tenderness or swelling    SKIN: Normal color for age and race; warm; no rash NEURO: Moves all extremities equally   MEDICAL DECISION MAKING: Patient here with hypoglycemia.  Blood sugar has improved.  Will offer him thickened liquids.  Will monitor closely.  Will check labs and urine.  Family at bedside and are comfortable with this plan.  They would like him to be transported back to the nursing facility if workup is unremarkable.  ED PROGRESS: Blood glucose continues to be normal.  His kidney function is elevated which is his baseline and chronic for him.  Otherwise labs and urine are unremarkable.  He is agitated after catheterization which family reports is normal for him.  I feel he is safe to be discharged back to his nursing facility with close monitoring of his blood glucose.  Family comfortable with this plan.   At this time, I do not feel there is any life-threatening condition present. I have reviewed and discussed all results (EKG, imaging, lab, urine as appropriate) and exam findings with patient/family. I have reviewed nursing notes and appropriate previous records.  I feel the patient is safe to be discharged home without further emergent workup and can continue workup as an outpatient as needed. Discussed usual and customary return precautions. Patient/family verbalize understanding and are comfortable with this plan.  Outpatient follow-up has been provided if needed. All questions have been answered.       EKG Interpretation  Date/Time:  Thursday October 16 2017 02:00:41 EST Ventricular Rate:  63 PR Interval:    QRS Duration: 224 QT  Interval:  444 QTC Calculation: 455 R Axis:   3 Text Interpretation:  Sinus rhythm Nonspecific intraventricular conduction delay Borderline T abnormalities, lateral leads No significant change since last tracing Confirmed by Shaun Runyon, Cyril Mourning [phone]) on 10/16/2017 4:23:56 AM         Jerriann Schrom, Delice Bison, DO 10/16/17 2446

## 2017-10-16 NOTE — ED Triage Notes (Signed)
Per EMS, pt from [facility] senior living c/o hypoglycemia. CBG 35. Pt was given 12g of D10, CBG 142. GCS went from 3 to 10. Pt baseline dementia, non-verbal. Upon arrival, CBG in room was 73.

## 2017-10-16 NOTE — ED Notes (Addendum)
Pt's family verbalizes understanding of d/c instructions. Pt waiting for PTAR. Pt discharged with all belongings and paperwork.

## 2017-10-16 NOTE — ED Notes (Signed)
Marquita at [facility] notified about pt's discharge from ED.

## 2017-10-16 NOTE — ED Notes (Signed)
Pt fed applesauce.

## 2017-10-20 DIAGNOSIS — Z794 Long term (current) use of insulin: Secondary | ICD-10-CM | POA: Diagnosis not present

## 2017-10-20 DIAGNOSIS — E1122 Type 2 diabetes mellitus with diabetic chronic kidney disease: Secondary | ICD-10-CM | POA: Diagnosis not present

## 2017-10-20 DIAGNOSIS — E11649 Type 2 diabetes mellitus with hypoglycemia without coma: Secondary | ICD-10-CM | POA: Diagnosis not present

## 2017-10-21 DIAGNOSIS — R2681 Unsteadiness on feet: Secondary | ICD-10-CM | POA: Diagnosis not present

## 2017-10-21 DIAGNOSIS — R262 Difficulty in walking, not elsewhere classified: Secondary | ICD-10-CM | POA: Diagnosis not present

## 2017-10-23 DIAGNOSIS — R262 Difficulty in walking, not elsewhere classified: Secondary | ICD-10-CM | POA: Diagnosis not present

## 2017-10-23 DIAGNOSIS — R2681 Unsteadiness on feet: Secondary | ICD-10-CM | POA: Diagnosis not present

## 2017-10-27 ENCOUNTER — Emergency Department (HOSPITAL_COMMUNITY)
Admission: EM | Admit: 2017-10-27 | Discharge: 2017-10-27 | Disposition: A | Discharge status: Home / Self Care | Payer: Medicare Other | Attending: Emergency Medicine | Admitting: Emergency Medicine

## 2017-10-27 ENCOUNTER — Emergency Department (HOSPITAL_COMMUNITY): Payer: Medicare Other

## 2017-10-27 DIAGNOSIS — E162 Hypoglycemia, unspecified: Secondary | ICD-10-CM | POA: Diagnosis present

## 2017-10-27 DIAGNOSIS — E11649 Type 2 diabetes mellitus with hypoglycemia without coma: Secondary | ICD-10-CM | POA: Insufficient documentation

## 2017-10-27 DIAGNOSIS — F0390 Unspecified dementia without behavioral disturbance: Secondary | ICD-10-CM | POA: Insufficient documentation

## 2017-10-27 DIAGNOSIS — Z79899 Other long term (current) drug therapy: Secondary | ICD-10-CM | POA: Insufficient documentation

## 2017-10-27 DIAGNOSIS — R2681 Unsteadiness on feet: Secondary | ICD-10-CM | POA: Diagnosis not present

## 2017-10-27 DIAGNOSIS — J9811 Atelectasis: Secondary | ICD-10-CM | POA: Diagnosis not present

## 2017-10-27 DIAGNOSIS — R030 Elevated blood-pressure reading, without diagnosis of hypertension: Secondary | ICD-10-CM | POA: Diagnosis not present

## 2017-10-27 DIAGNOSIS — Z794 Long term (current) use of insulin: Secondary | ICD-10-CM | POA: Insufficient documentation

## 2017-10-27 DIAGNOSIS — I10 Essential (primary) hypertension: Secondary | ICD-10-CM | POA: Insufficient documentation

## 2017-10-27 DIAGNOSIS — R4182 Altered mental status, unspecified: Secondary | ICD-10-CM | POA: Diagnosis not present

## 2017-10-27 DIAGNOSIS — E161 Other hypoglycemia: Secondary | ICD-10-CM | POA: Diagnosis not present

## 2017-10-27 DIAGNOSIS — F0391 Unspecified dementia with behavioral disturbance: Secondary | ICD-10-CM | POA: Diagnosis not present

## 2017-10-27 DIAGNOSIS — E10649 Type 1 diabetes mellitus with hypoglycemia without coma: Secondary | ICD-10-CM | POA: Diagnosis not present

## 2017-10-27 DIAGNOSIS — R262 Difficulty in walking, not elsewhere classified: Secondary | ICD-10-CM | POA: Diagnosis not present

## 2017-10-27 DIAGNOSIS — R404 Transient alteration of awareness: Secondary | ICD-10-CM | POA: Diagnosis not present

## 2017-10-27 LAB — URINALYSIS, ROUTINE W REFLEX MICROSCOPIC
BILIRUBIN URINE: NEGATIVE
Glucose, UA: 50 mg/dL — AB
HGB URINE DIPSTICK: NEGATIVE
Ketones, ur: NEGATIVE mg/dL
Leukocytes, UA: NEGATIVE
Nitrite: NEGATIVE
PROTEIN: NEGATIVE mg/dL
Specific Gravity, Urine: 1.016 (ref 1.005–1.030)
pH: 6 (ref 5.0–8.0)

## 2017-10-27 LAB — BASIC METABOLIC PANEL
Anion gap: 10 (ref 5–15)
BUN: 45 mg/dL — AB (ref 6–20)
CO2: 24 mmol/L (ref 22–32)
CREATININE: 1.92 mg/dL — AB (ref 0.61–1.24)
Calcium: 8.5 mg/dL — ABNORMAL LOW (ref 8.9–10.3)
Chloride: 105 mmol/L (ref 101–111)
GFR calc Af Amer: 32 mL/min — ABNORMAL LOW (ref >60)
GFR, EST NON AFRICAN AMERICAN: 28 mL/min — AB (ref >60)
GLUCOSE: 41 mg/dL — AB (ref 65–99)
POTASSIUM: 3.6 mmol/L (ref 3.5–5.1)
SODIUM: 139 mmol/L (ref 135–145)

## 2017-10-27 LAB — CBC WITH DIFFERENTIAL/PLATELET
Basophils Absolute: 0 10*3/uL (ref 0.0–0.1)
Basophils Relative: 0 %
EOS ABS: 0.3 10*3/uL (ref 0.0–0.7)
EOS PCT: 4 %
HCT: 30.9 % — ABNORMAL LOW (ref 39.0–52.0)
Hemoglobin: 9.9 g/dL — ABNORMAL LOW (ref 13.0–17.0)
LYMPHS ABS: 1.1 10*3/uL (ref 0.7–4.0)
LYMPHS PCT: 16 %
MCH: 30.4 pg (ref 26.0–34.0)
MCHC: 32 g/dL (ref 30.0–36.0)
MCV: 94.8 fL (ref 78.0–100.0)
MONO ABS: 0.5 10*3/uL (ref 0.1–1.0)
Monocytes Relative: 8 %
Neutro Abs: 5 10*3/uL (ref 1.7–7.7)
Neutrophils Relative %: 72 %
PLATELETS: 199 10*3/uL (ref 150–400)
RBC: 3.26 MIL/uL — ABNORMAL LOW (ref 4.22–5.81)
RDW: 15 % (ref 11.5–15.5)
WBC: 6.9 10*3/uL (ref 4.0–10.5)

## 2017-10-27 LAB — CBG MONITORING, ED
GLUCOSE-CAPILLARY: 105 mg/dL — AB (ref 65–99)
GLUCOSE-CAPILLARY: 111 mg/dL — AB (ref 65–99)
Glucose-Capillary: 102 mg/dL — ABNORMAL HIGH (ref 65–99)
Glucose-Capillary: 115 mg/dL — ABNORMAL HIGH (ref 65–99)
Glucose-Capillary: 124 mg/dL — ABNORMAL HIGH (ref 65–99)
Glucose-Capillary: 37 mg/dL — CL (ref 65–99)
Glucose-Capillary: 39 mg/dL — CL (ref 65–99)

## 2017-10-27 MED ORDER — RESOURCE THICKENUP CLEAR PO POWD
ORAL | Status: DC | PRN
  Administered 2017-10-27: 06:00:00 via ORAL
  Filled 2017-10-27: qty 125

## 2017-10-27 MED ORDER — DEXTROSE 10 % IV SOLN: INTRAVENOUS | Status: DC

## 2017-10-27 MED ORDER — DEXTROSE 50 % IV SOLN: 50.0000 mL | Freq: Once | INTRAVENOUS | Status: DC

## 2017-10-27 MED ORDER — INSULIN GLARGINE 100 UNIT/ML ~~LOC~~ SOLN: 12.0000 [IU] | Freq: Every day | SUBCUTANEOUS | 0 refills | Status: AC

## 2017-10-27 MED ORDER — DEXTROSE 50 % IV SOLN
50.0000 mL | Freq: Once | INTRAVENOUS | Status: AC
  Administered 2017-10-27: 50 mL via INTRAVENOUS
  Filled 2017-10-27: qty 50

## 2017-10-27 MED ORDER — SODIUM CHLORIDE 0.9 % IV BOLUS (SEPSIS)
500.0000 mL | Freq: Once | INTRAVENOUS | Status: AC
  Administered 2017-10-27: 500 mL via INTRAVENOUS

## 2017-10-27 NOTE — ED Notes (Signed)
Holding dextrose 10% infusion at this time, per Dr. Wyvonnia Dusky.

## 2017-10-27 NOTE — ED Notes (Addendum)
CBG 102 

## 2017-10-27 NOTE — ED Notes (Signed)
Portable xray at bedside.

## 2017-10-27 NOTE — ED Notes (Signed)
Drank 8 oz apple juice with thickener and one cup of applesauce.  To recheck CBG in 30 minutes per physician request.

## 2017-10-27 NOTE — ED Triage Notes (Signed)
Pt BIB EMS from [facility] for hypoglycemia. Per EMS they were called out twice since last night 1/13. Facility and EMS getting different CBG readings 37 vs 72; CBG 39 at this time. No PIV; pt alert, dementia at baseline. EDP at bedside.

## 2017-10-27 NOTE — Discharge Instructions (Signed)
Reduce lantus insulin to 12 units QHS for now, Follow blood sugars closely and follow up with primary doctor. Return to the ED if you develop new or worsening symptoms.

## 2017-10-27 NOTE — ED Provider Notes (Signed)
[facility] EMERGENCY DEPARTMENT Provider Note   CSN: [id number] Arrival date & time: 10/27/17  0043     History   Chief Complaint Chief Complaint  Patient presents with  . Hypoglycemia    HPI [name] is a 84 y.o. male.  Level 5 caveat for dementia.  Patient from assisted living facility with hyperglycemia.  EMS states previous crew was called out around 10 PM for a blood sugar of 40.  They found it to be in the 70s and patient was not transported.  EMS was called again and found patient to have blood sugar reading at 37.  It then went up to 72.  On arrival patient's blood sugars 39.  He is alert but has dementia at baseline.  His mental status is normal per family at bedside.  He has been eating normally.  No vomiting, fever or diarrhea.  Patient takes 24 units of Lantus at bedtime which she did receive tonight, Humalog with meals as well as Januvia. No recent medication changes.  Similar presentation to the ED on January 3.   The history is provided by the patient, the EMS personnel and a relative. The history is limited by the condition of the patient.  Hypoglycemia    Past Medical History:  Diagnosis Date  . Anemia   . Aphasia   . CAD (coronary artery disease)   . Dementia   . Diabetes ([place]) 05/20/2017  . DM (diabetes mellitus) ([place])   . Gout   . Hyperlipidemia   . Hypertension   . Prostate cancer ([place])   . Thyroid disease     Patient Active Problem List   Diagnosis Date Noted  . Acute respiratory failure with hypoxia ([place])   . SOB (shortness of breath)   . Hypoxia 05/20/2017  . Diabetes ([place]) 05/20/2017  . Dementia 12/17/2010  . FULL INCONTINENCE OF FECES 12/17/2010  . DIARRHEA 12/17/2010  . CAD, NATIVE VESSEL 11/20/2010  . Essential hypertension 11/19/2010    Past Surgical History:  Procedure Laterality Date  . CATARACT EXTRACTION  2000  . [place] Medications    Prior to Admission medications     Medication Sig Start Date End Date Taking? Authorizing Provider  acetaminophen (TYLENOL) 500 MG tablet Take 500 mg by mouth every 8 (eight) hours as needed for moderate pain.    [provider]  amLODipine (NORVASC) 2.5 MG tablet Take 2.5 mg by mouth every morning.    [provider]  aspirin 81 MG chewable tablet Chew 81 mg by mouth every morning.    [provider]  Calcium Carbonate-Vitamin D (CALCIUM 600+D) 600-400 MG-UNIT tablet Take 1 tablet by mouth 2 (two) times daily.    [provider]  carvedilol (COREG) 12.5 MG tablet Take 12.5 mg by mouth 2 (two) times daily.     [provider]  ezetimibe-simvastatin (VYTORIN) 10-10 MG per tablet Take 1 tablet by mouth at bedtime.      [provider]  ferrous sulfate 325 (65 FE) MG tablet Take 325 mg by mouth daily with breakfast.    [provider]  haloperidol (HALDOL) 0.5 MG tablet Take 0.5 mg by mouth 2 (two) times daily.     [provider]  hydrochlorothiazide (HYDRODIURIL) 25 MG tablet Take 25 mg by mouth every morning.    [provider]  Insulin Lispro Prot & Lispro (HUMALOG 75/25 MIX) (75-25) 100 UNIT/ML Kwikpen Inject 16  Units into the skin 2 (two) times daily.     [provider]  ipratropium (ATROVENT) 0.06 % nasal spray Place 2 sprays into both nostrils 2 (two) times daily.    [provider]  levothyroxine (SYNTHROID, LEVOTHROID) 88 MCG tablet Take 88 mcg by mouth daily before breakfast.    [provider]  loratadine (CLARITIN) 10 MG tablet Take 10 mg by mouth at bedtime.    [provider]  Multiple Vitamins-Minerals (ICAPS AREDS 2 PO) Take 2 capsules by mouth daily.    [provider]  nystatin cream (MYCOSTATIN) Apply 1 application topically 2 (two) times daily.    [provider]  vitamin B-12 (CYANOCOBALAMIN) 1000 MCG tablet Take 1,000 mcg by mouth every morning.     [provider]     Family History Family History  Problem Relation Age of Onset  . Heart disease Brother   . Diabetes Brother   . Hypertension Mother   . Colon cancer Neg Hx     Social History Social History   Tobacco Use  . Smoking status: Never Smoker  . Smokeless tobacco: Never Used  Substance Use Topics  . Alcohol use: No  . Drug use: No     Allergies   Patient has no known allergies.   Review of Systems Review of Systems  Unable to perform ROS: Dementia     Physical Exam Updated Vital Signs SpO2 98%   Physical Exam  Constitutional: He is oriented to person, place, and time. He appears well-developed and well-nourished. No distress.  Alert, pale appearing  HENT:  Head: Normocephalic and atraumatic.  Mouth/Throat: Oropharynx is clear and moist. No oropharyngeal exudate.  Eyes: Conjunctivae and EOM are normal. Pupils are equal, round, and reactive to light.  Neck: Normal range of motion. Neck supple.  No meningismus.  Cardiovascular: Normal rate, regular rhythm, normal heart sounds and intact distal pulses.  No murmur heard. Pulmonary/Chest: Effort normal and breath sounds normal. No respiratory distress.  Abdominal: Soft. There is no tenderness. There is no rebound and no guarding.  Musculoskeletal: Normal range of motion. He exhibits no edema or tenderness.  Neurological: He is alert and oriented to person, place, and time. No cranial nerve deficit. He exhibits normal muscle tone. Coordination normal.  Patient is alert, mostly nonverbal, moves all extremities, follows some commands  Skin: Skin is warm.  Psychiatric: He has a normal mood and affect. His behavior is normal.  Nursing note and vitals reviewed.    ED Treatments / Results  Labs (all labs ordered are listed, but only abnormal results are displayed) Labs Reviewed  CBC WITH DIFFERENTIAL/PLATELET - Abnormal; Notable for the following components:      Result Value   RBC 3.26 (*)    Hemoglobin 9.9 (*)     HCT 30.9 (*)    All other components within normal limits  BASIC METABOLIC PANEL - Abnormal; Notable for the following components:   Glucose, Bld 41 (*)    BUN 45 (*)    Creatinine, Ser 1.92 (*)    Calcium 8.5 (*)    GFR calc non Af Amer 28 (*)    GFR calc Af Amer 32 (*)    All other components within normal limits  URINALYSIS, ROUTINE W REFLEX MICROSCOPIC - Abnormal; Notable for the following components:   Color, Urine STRAW (*)    Glucose, UA 50 (*)    All other components within normal limits  CBG MONITORING, ED - Abnormal; Notable  for the following components:   Glucose-Capillary 39 (*)    All other components within normal limits  CBG MONITORING, ED - Abnormal; Notable for the following components:   Glucose-Capillary 37 (*)    All other components within normal limits  CBG MONITORING, ED - Abnormal; Notable for the following components:   Glucose-Capillary 115 (*)    All other components within normal limits  CBG MONITORING, ED - Abnormal; Notable for the following components:   Glucose-Capillary 102 (*)    All other components within normal limits  CBG MONITORING, ED - Abnormal; Notable for the following components:   Glucose-Capillary 105 (*)    All other components within normal limits  CBG MONITORING, ED - Abnormal; Notable for the following components:   Glucose-Capillary 111 (*)    All other components within normal limits  CBG MONITORING, ED - Abnormal; Notable for the following components:   Glucose-Capillary 124 (*)    All other components within normal limits    EKG  EKG Interpretation  Date/Time:  Monday October 27 2017 01:13:45 EST Ventricular Rate:  66 PR Interval:    QRS Duration: 160 QT Interval:  418 QTC Calculation: 438 R Axis:   35 Text Interpretation:  Sinus rhythm Short PR interval Consider right atrial enlargement Left bundle branch block Artifact in lead(s) I III aVR aVL V2 Artifact Confirmed by Ezequiel Essex [phone]) on 10/27/2017 1:19:30  AM Also confirmed by Ezequiel Essex [phone]), editor Oswaldo Milian, [place] ([id number])  on 10/27/2017 7:02:00 AM       Radiology Dg Chest Portable 1 View  Result Date: 10/27/2017 CLINICAL DATA:  Hypoglycemia.  History of diabetes and dementia. EXAM: PORTABLE CHEST 1 VIEW COMPARISON:  Chest radiograph May 21, 2017 FINDINGS: Cardiac silhouette is normal in size. Prominent hilar vascular shadows unchanged. Calcified aortic knob. Mild chronic interstitial changes with bibasilar strandy densities. No pleural effusion the LEFT costophrenic angle is incompletely imaged. No pneumothorax. Biapical pleural thickening. Osteopenia. Soft tissue planes are nonsuspicious. IMPRESSION: Mild chronic interstitial changes and bibasilar atelectasis. Aortic Atherosclerosis (ICD10-I70.0). Electronically Signed   By: Elon Alas M.D.   On: 10/27/2017 01:27    Procedures Procedures (including critical care time)  Medications Ordered in ED Medications  dextrose 50 % solution 50 mL (50 mLs Intravenous Given 10/27/17 0100)     Initial Impression / Assessment and Plan / ED Course  I have reviewed the triage vital signs and the nursing notes.  Pertinent labs & imaging results that were available during my care of the patient were reviewed by me and considered in my medical decision making (see chart for details).    Patient from nursing home with hypoglycemia. Lantus given tonight by report.  Family at bedside states patient is at his baseline  Blood sugar improved to 115 after D50. Labs obtained before D50 given show hypoglycemia.  No evidence of infection. UA and CXR negative.  Blood sugars have improved to >100 on several checks. He is at his baseline per his family at bedside. Appears stable to return to facility.  Will recommend reducing lantus dose 50% for time being and following his sugars closely. Further adjustments to his regimen by his physician. Return precautions discussed.  Final Clinical  Impressions(s) / ED Diagnoses   Final diagnoses:  Hypoglycemia    ED Discharge Orders    None       Lourie Retz, Annie Main, MD 10/27/17 [phone]

## 2017-10-29 DIAGNOSIS — R531 Weakness: Secondary | ICD-10-CM | POA: Diagnosis not present

## 2017-10-30 DIAGNOSIS — R262 Difficulty in walking, not elsewhere classified: Secondary | ICD-10-CM | POA: Diagnosis not present

## 2017-10-30 DIAGNOSIS — R2681 Unsteadiness on feet: Secondary | ICD-10-CM | POA: Diagnosis not present

## 2017-11-05 DIAGNOSIS — R262 Difficulty in walking, not elsewhere classified: Secondary | ICD-10-CM | POA: Diagnosis not present

## 2017-11-05 DIAGNOSIS — R2681 Unsteadiness on feet: Secondary | ICD-10-CM | POA: Diagnosis not present

## 2017-11-06 DIAGNOSIS — R2681 Unsteadiness on feet: Secondary | ICD-10-CM | POA: Diagnosis not present

## 2017-11-06 DIAGNOSIS — R262 Difficulty in walking, not elsewhere classified: Secondary | ICD-10-CM | POA: Diagnosis not present

## 2017-11-12 DIAGNOSIS — R2681 Unsteadiness on feet: Secondary | ICD-10-CM | POA: Diagnosis not present

## 2017-11-12 DIAGNOSIS — R262 Difficulty in walking, not elsewhere classified: Secondary | ICD-10-CM | POA: Diagnosis not present

## 2017-11-13 DIAGNOSIS — R2681 Unsteadiness on feet: Secondary | ICD-10-CM | POA: Diagnosis not present

## 2017-11-13 DIAGNOSIS — R262 Difficulty in walking, not elsewhere classified: Secondary | ICD-10-CM | POA: Diagnosis not present

## 2017-11-17 DIAGNOSIS — R2681 Unsteadiness on feet: Secondary | ICD-10-CM | POA: Diagnosis not present

## 2017-11-17 DIAGNOSIS — R262 Difficulty in walking, not elsewhere classified: Secondary | ICD-10-CM | POA: Diagnosis not present

## 2017-11-18 DIAGNOSIS — E039 Hypothyroidism, unspecified: Secondary | ICD-10-CM | POA: Diagnosis not present

## 2017-11-18 DIAGNOSIS — I10 Essential (primary) hypertension: Secondary | ICD-10-CM | POA: Diagnosis not present

## 2017-11-18 DIAGNOSIS — Z8546 Personal history of malignant neoplasm of prostate: Secondary | ICD-10-CM | POA: Diagnosis not present

## 2017-11-18 DIAGNOSIS — R1310 Dysphagia, unspecified: Secondary | ICD-10-CM | POA: Diagnosis not present

## 2017-11-18 DIAGNOSIS — N184 Chronic kidney disease, stage 4 (severe): Secondary | ICD-10-CM | POA: Diagnosis not present

## 2017-11-18 DIAGNOSIS — I679 Cerebrovascular disease, unspecified: Secondary | ICD-10-CM | POA: Diagnosis not present

## 2017-11-18 DIAGNOSIS — E785 Hyperlipidemia, unspecified: Secondary | ICD-10-CM | POA: Diagnosis not present

## 2017-11-18 DIAGNOSIS — J309 Allergic rhinitis, unspecified: Secondary | ICD-10-CM | POA: Diagnosis not present

## 2017-11-18 DIAGNOSIS — I672 Cerebral atherosclerosis: Secondary | ICD-10-CM | POA: Diagnosis not present

## 2017-11-18 DIAGNOSIS — F0151 Vascular dementia with behavioral disturbance: Secondary | ICD-10-CM | POA: Diagnosis not present

## 2017-11-18 DIAGNOSIS — R4701 Aphasia: Secondary | ICD-10-CM | POA: Diagnosis not present

## 2017-11-18 DIAGNOSIS — M109 Gout, unspecified: Secondary | ICD-10-CM | POA: Diagnosis not present

## 2017-11-18 DIAGNOSIS — R54 Age-related physical debility: Secondary | ICD-10-CM | POA: Diagnosis not present

## 2017-11-18 DIAGNOSIS — E1159 Type 2 diabetes mellitus with other circulatory complications: Secondary | ICD-10-CM | POA: Diagnosis not present

## 2017-11-18 DIAGNOSIS — I25810 Atherosclerosis of coronary artery bypass graft(s) without angina pectoris: Secondary | ICD-10-CM | POA: Diagnosis not present

## 2017-11-19 DIAGNOSIS — R2681 Unsteadiness on feet: Secondary | ICD-10-CM | POA: Diagnosis not present

## 2017-11-19 DIAGNOSIS — I679 Cerebrovascular disease, unspecified: Secondary | ICD-10-CM | POA: Diagnosis not present

## 2017-11-19 DIAGNOSIS — I25810 Atherosclerosis of coronary artery bypass graft(s) without angina pectoris: Secondary | ICD-10-CM | POA: Diagnosis not present

## 2017-11-19 DIAGNOSIS — N184 Chronic kidney disease, stage 4 (severe): Secondary | ICD-10-CM | POA: Diagnosis not present

## 2017-11-19 DIAGNOSIS — F0151 Vascular dementia with behavioral disturbance: Secondary | ICD-10-CM | POA: Diagnosis not present

## 2017-11-19 DIAGNOSIS — E1159 Type 2 diabetes mellitus with other circulatory complications: Secondary | ICD-10-CM | POA: Diagnosis not present

## 2017-11-19 DIAGNOSIS — R262 Difficulty in walking, not elsewhere classified: Secondary | ICD-10-CM | POA: Diagnosis not present

## 2017-11-19 DIAGNOSIS — I672 Cerebral atherosclerosis: Secondary | ICD-10-CM | POA: Diagnosis not present

## 2017-11-20 DIAGNOSIS — R262 Difficulty in walking, not elsewhere classified: Secondary | ICD-10-CM | POA: Diagnosis not present

## 2017-11-20 DIAGNOSIS — R2681 Unsteadiness on feet: Secondary | ICD-10-CM | POA: Diagnosis not present

## 2017-11-22 DIAGNOSIS — I25810 Atherosclerosis of coronary artery bypass graft(s) without angina pectoris: Secondary | ICD-10-CM | POA: Diagnosis not present

## 2017-11-22 DIAGNOSIS — I679 Cerebrovascular disease, unspecified: Secondary | ICD-10-CM | POA: Diagnosis not present

## 2017-11-22 DIAGNOSIS — E1159 Type 2 diabetes mellitus with other circulatory complications: Secondary | ICD-10-CM | POA: Diagnosis not present

## 2017-11-22 DIAGNOSIS — F0151 Vascular dementia with behavioral disturbance: Secondary | ICD-10-CM | POA: Diagnosis not present

## 2017-11-22 DIAGNOSIS — I672 Cerebral atherosclerosis: Secondary | ICD-10-CM | POA: Diagnosis not present

## 2017-11-22 DIAGNOSIS — N184 Chronic kidney disease, stage 4 (severe): Secondary | ICD-10-CM | POA: Diagnosis not present

## 2017-11-25 DIAGNOSIS — I25810 Atherosclerosis of coronary artery bypass graft(s) without angina pectoris: Secondary | ICD-10-CM | POA: Diagnosis not present

## 2017-11-25 DIAGNOSIS — F0151 Vascular dementia with behavioral disturbance: Secondary | ICD-10-CM | POA: Diagnosis not present

## 2017-11-25 DIAGNOSIS — I679 Cerebrovascular disease, unspecified: Secondary | ICD-10-CM | POA: Diagnosis not present

## 2017-11-25 DIAGNOSIS — E1159 Type 2 diabetes mellitus with other circulatory complications: Secondary | ICD-10-CM | POA: Diagnosis not present

## 2017-11-25 DIAGNOSIS — I672 Cerebral atherosclerosis: Secondary | ICD-10-CM | POA: Diagnosis not present

## 2017-11-25 DIAGNOSIS — N184 Chronic kidney disease, stage 4 (severe): Secondary | ICD-10-CM | POA: Diagnosis not present

## 2017-11-28 DIAGNOSIS — E1159 Type 2 diabetes mellitus with other circulatory complications: Secondary | ICD-10-CM | POA: Diagnosis not present

## 2017-11-28 DIAGNOSIS — I679 Cerebrovascular disease, unspecified: Secondary | ICD-10-CM | POA: Diagnosis not present

## 2017-11-28 DIAGNOSIS — F0151 Vascular dementia with behavioral disturbance: Secondary | ICD-10-CM | POA: Diagnosis not present

## 2017-11-28 DIAGNOSIS — I25810 Atherosclerosis of coronary artery bypass graft(s) without angina pectoris: Secondary | ICD-10-CM | POA: Diagnosis not present

## 2017-11-28 DIAGNOSIS — I672 Cerebral atherosclerosis: Secondary | ICD-10-CM | POA: Diagnosis not present

## 2017-11-28 DIAGNOSIS — N184 Chronic kidney disease, stage 4 (severe): Secondary | ICD-10-CM | POA: Diagnosis not present

## 2017-11-30 DIAGNOSIS — E1159 Type 2 diabetes mellitus with other circulatory complications: Secondary | ICD-10-CM | POA: Diagnosis not present

## 2017-11-30 DIAGNOSIS — N184 Chronic kidney disease, stage 4 (severe): Secondary | ICD-10-CM | POA: Diagnosis not present

## 2017-11-30 DIAGNOSIS — I679 Cerebrovascular disease, unspecified: Secondary | ICD-10-CM | POA: Diagnosis not present

## 2017-11-30 DIAGNOSIS — I25810 Atherosclerosis of coronary artery bypass graft(s) without angina pectoris: Secondary | ICD-10-CM | POA: Diagnosis not present

## 2017-11-30 DIAGNOSIS — F0151 Vascular dementia with behavioral disturbance: Secondary | ICD-10-CM | POA: Diagnosis not present

## 2017-11-30 DIAGNOSIS — I672 Cerebral atherosclerosis: Secondary | ICD-10-CM | POA: Diagnosis not present

## 2017-12-02 DIAGNOSIS — F0151 Vascular dementia with behavioral disturbance: Secondary | ICD-10-CM | POA: Diagnosis not present

## 2017-12-02 DIAGNOSIS — I25810 Atherosclerosis of coronary artery bypass graft(s) without angina pectoris: Secondary | ICD-10-CM | POA: Diagnosis not present

## 2017-12-02 DIAGNOSIS — N184 Chronic kidney disease, stage 4 (severe): Secondary | ICD-10-CM | POA: Diagnosis not present

## 2017-12-02 DIAGNOSIS — I672 Cerebral atherosclerosis: Secondary | ICD-10-CM | POA: Diagnosis not present

## 2017-12-02 DIAGNOSIS — E1159 Type 2 diabetes mellitus with other circulatory complications: Secondary | ICD-10-CM | POA: Diagnosis not present

## 2017-12-02 DIAGNOSIS — I679 Cerebrovascular disease, unspecified: Secondary | ICD-10-CM | POA: Diagnosis not present

## 2017-12-04 DIAGNOSIS — I679 Cerebrovascular disease, unspecified: Secondary | ICD-10-CM | POA: Diagnosis not present

## 2017-12-04 DIAGNOSIS — I25810 Atherosclerosis of coronary artery bypass graft(s) without angina pectoris: Secondary | ICD-10-CM | POA: Diagnosis not present

## 2017-12-04 DIAGNOSIS — F0151 Vascular dementia with behavioral disturbance: Secondary | ICD-10-CM | POA: Diagnosis not present

## 2017-12-04 DIAGNOSIS — N184 Chronic kidney disease, stage 4 (severe): Secondary | ICD-10-CM | POA: Diagnosis not present

## 2017-12-04 DIAGNOSIS — E1159 Type 2 diabetes mellitus with other circulatory complications: Secondary | ICD-10-CM | POA: Diagnosis not present

## 2017-12-04 DIAGNOSIS — I672 Cerebral atherosclerosis: Secondary | ICD-10-CM | POA: Diagnosis not present

## 2017-12-05 DIAGNOSIS — N184 Chronic kidney disease, stage 4 (severe): Secondary | ICD-10-CM | POA: Diagnosis not present

## 2017-12-05 DIAGNOSIS — I672 Cerebral atherosclerosis: Secondary | ICD-10-CM | POA: Diagnosis not present

## 2017-12-05 DIAGNOSIS — I25810 Atherosclerosis of coronary artery bypass graft(s) without angina pectoris: Secondary | ICD-10-CM | POA: Diagnosis not present

## 2017-12-05 DIAGNOSIS — E1159 Type 2 diabetes mellitus with other circulatory complications: Secondary | ICD-10-CM | POA: Diagnosis not present

## 2017-12-05 DIAGNOSIS — F0151 Vascular dementia with behavioral disturbance: Secondary | ICD-10-CM | POA: Diagnosis not present

## 2017-12-05 DIAGNOSIS — I679 Cerebrovascular disease, unspecified: Secondary | ICD-10-CM | POA: Diagnosis not present

## 2017-12-07 DIAGNOSIS — F0151 Vascular dementia with behavioral disturbance: Secondary | ICD-10-CM | POA: Diagnosis not present

## 2017-12-07 DIAGNOSIS — I25810 Atherosclerosis of coronary artery bypass graft(s) without angina pectoris: Secondary | ICD-10-CM | POA: Diagnosis not present

## 2017-12-07 DIAGNOSIS — N184 Chronic kidney disease, stage 4 (severe): Secondary | ICD-10-CM | POA: Diagnosis not present

## 2017-12-07 DIAGNOSIS — I679 Cerebrovascular disease, unspecified: Secondary | ICD-10-CM | POA: Diagnosis not present

## 2017-12-07 DIAGNOSIS — E1159 Type 2 diabetes mellitus with other circulatory complications: Secondary | ICD-10-CM | POA: Diagnosis not present

## 2017-12-07 DIAGNOSIS — I672 Cerebral atherosclerosis: Secondary | ICD-10-CM | POA: Diagnosis not present

## 2017-12-09 DIAGNOSIS — E1159 Type 2 diabetes mellitus with other circulatory complications: Secondary | ICD-10-CM | POA: Diagnosis not present

## 2017-12-09 DIAGNOSIS — I672 Cerebral atherosclerosis: Secondary | ICD-10-CM | POA: Diagnosis not present

## 2017-12-09 DIAGNOSIS — F0151 Vascular dementia with behavioral disturbance: Secondary | ICD-10-CM | POA: Diagnosis not present

## 2017-12-09 DIAGNOSIS — I679 Cerebrovascular disease, unspecified: Secondary | ICD-10-CM | POA: Diagnosis not present

## 2017-12-09 DIAGNOSIS — N184 Chronic kidney disease, stage 4 (severe): Secondary | ICD-10-CM | POA: Diagnosis not present

## 2017-12-09 DIAGNOSIS — I25810 Atherosclerosis of coronary artery bypass graft(s) without angina pectoris: Secondary | ICD-10-CM | POA: Diagnosis not present

## 2017-12-12 DIAGNOSIS — I672 Cerebral atherosclerosis: Secondary | ICD-10-CM | POA: Diagnosis not present

## 2017-12-12 DIAGNOSIS — R1310 Dysphagia, unspecified: Secondary | ICD-10-CM | POA: Diagnosis not present

## 2017-12-12 DIAGNOSIS — N184 Chronic kidney disease, stage 4 (severe): Secondary | ICD-10-CM | POA: Diagnosis not present

## 2017-12-12 DIAGNOSIS — J309 Allergic rhinitis, unspecified: Secondary | ICD-10-CM | POA: Diagnosis not present

## 2017-12-12 DIAGNOSIS — I10 Essential (primary) hypertension: Secondary | ICD-10-CM | POA: Diagnosis not present

## 2017-12-12 DIAGNOSIS — M109 Gout, unspecified: Secondary | ICD-10-CM | POA: Diagnosis not present

## 2017-12-12 DIAGNOSIS — E1159 Type 2 diabetes mellitus with other circulatory complications: Secondary | ICD-10-CM | POA: Diagnosis not present

## 2017-12-12 DIAGNOSIS — E039 Hypothyroidism, unspecified: Secondary | ICD-10-CM | POA: Diagnosis not present

## 2017-12-12 DIAGNOSIS — R4701 Aphasia: Secondary | ICD-10-CM | POA: Diagnosis not present

## 2017-12-12 DIAGNOSIS — I679 Cerebrovascular disease, unspecified: Secondary | ICD-10-CM | POA: Diagnosis not present

## 2017-12-12 DIAGNOSIS — I25810 Atherosclerosis of coronary artery bypass graft(s) without angina pectoris: Secondary | ICD-10-CM | POA: Diagnosis not present

## 2017-12-12 DIAGNOSIS — E785 Hyperlipidemia, unspecified: Secondary | ICD-10-CM | POA: Diagnosis not present

## 2017-12-12 DIAGNOSIS — Z8546 Personal history of malignant neoplasm of prostate: Secondary | ICD-10-CM | POA: Diagnosis not present

## 2017-12-12 DIAGNOSIS — R54 Age-related physical debility: Secondary | ICD-10-CM | POA: Diagnosis not present

## 2017-12-12 DIAGNOSIS — F0151 Vascular dementia with behavioral disturbance: Secondary | ICD-10-CM | POA: Diagnosis not present

## 2017-12-14 DIAGNOSIS — F0151 Vascular dementia with behavioral disturbance: Secondary | ICD-10-CM | POA: Diagnosis not present

## 2017-12-14 DIAGNOSIS — I679 Cerebrovascular disease, unspecified: Secondary | ICD-10-CM | POA: Diagnosis not present

## 2017-12-14 DIAGNOSIS — I672 Cerebral atherosclerosis: Secondary | ICD-10-CM | POA: Diagnosis not present

## 2017-12-14 DIAGNOSIS — E1159 Type 2 diabetes mellitus with other circulatory complications: Secondary | ICD-10-CM | POA: Diagnosis not present

## 2017-12-14 DIAGNOSIS — I25810 Atherosclerosis of coronary artery bypass graft(s) without angina pectoris: Secondary | ICD-10-CM | POA: Diagnosis not present

## 2017-12-14 DIAGNOSIS — N184 Chronic kidney disease, stage 4 (severe): Secondary | ICD-10-CM | POA: Diagnosis not present

## 2017-12-16 DIAGNOSIS — N184 Chronic kidney disease, stage 4 (severe): Secondary | ICD-10-CM | POA: Diagnosis not present

## 2017-12-16 DIAGNOSIS — I672 Cerebral atherosclerosis: Secondary | ICD-10-CM | POA: Diagnosis not present

## 2017-12-16 DIAGNOSIS — E1159 Type 2 diabetes mellitus with other circulatory complications: Secondary | ICD-10-CM | POA: Diagnosis not present

## 2017-12-16 DIAGNOSIS — F0151 Vascular dementia with behavioral disturbance: Secondary | ICD-10-CM | POA: Diagnosis not present

## 2017-12-16 DIAGNOSIS — I25810 Atherosclerosis of coronary artery bypass graft(s) without angina pectoris: Secondary | ICD-10-CM | POA: Diagnosis not present

## 2017-12-16 DIAGNOSIS — I679 Cerebrovascular disease, unspecified: Secondary | ICD-10-CM | POA: Diagnosis not present

## 2017-12-19 DIAGNOSIS — I25810 Atherosclerosis of coronary artery bypass graft(s) without angina pectoris: Secondary | ICD-10-CM | POA: Diagnosis not present

## 2017-12-19 DIAGNOSIS — E1159 Type 2 diabetes mellitus with other circulatory complications: Secondary | ICD-10-CM | POA: Diagnosis not present

## 2017-12-19 DIAGNOSIS — N184 Chronic kidney disease, stage 4 (severe): Secondary | ICD-10-CM | POA: Diagnosis not present

## 2017-12-19 DIAGNOSIS — I672 Cerebral atherosclerosis: Secondary | ICD-10-CM | POA: Diagnosis not present

## 2017-12-19 DIAGNOSIS — I679 Cerebrovascular disease, unspecified: Secondary | ICD-10-CM | POA: Diagnosis not present

## 2017-12-19 DIAGNOSIS — F0151 Vascular dementia with behavioral disturbance: Secondary | ICD-10-CM | POA: Diagnosis not present

## 2017-12-21 DIAGNOSIS — N184 Chronic kidney disease, stage 4 (severe): Secondary | ICD-10-CM | POA: Diagnosis not present

## 2017-12-21 DIAGNOSIS — I25810 Atherosclerosis of coronary artery bypass graft(s) without angina pectoris: Secondary | ICD-10-CM | POA: Diagnosis not present

## 2017-12-21 DIAGNOSIS — F0151 Vascular dementia with behavioral disturbance: Secondary | ICD-10-CM | POA: Diagnosis not present

## 2017-12-21 DIAGNOSIS — E1159 Type 2 diabetes mellitus with other circulatory complications: Secondary | ICD-10-CM | POA: Diagnosis not present

## 2017-12-21 DIAGNOSIS — I679 Cerebrovascular disease, unspecified: Secondary | ICD-10-CM | POA: Diagnosis not present

## 2017-12-21 DIAGNOSIS — I672 Cerebral atherosclerosis: Secondary | ICD-10-CM | POA: Diagnosis not present

## 2017-12-23 IMAGING — CT CT ABD-PELV W/O CM
2 of 4 series · 16 of 46 positions shown, 18 images · non-contrast
Comparison: None.

CLINICAL DATA: [AGE]/o  M; 2 episodes of vomiting.

EXAM:
CT ABDOMEN AND PELVIS WITHOUT CONTRAST
TECHNIQUE: Multidetector CT imaging of the abdomen and pelvis was performed
following the standard protocol without IV contrast.

[Series 2: abd/pel w/o · axial · non-contrast · 0.87mm/px · z∈[-712,-292]mm · 13 of 94 slices shown, 15 images]
[im 5/94  soft-tissue]
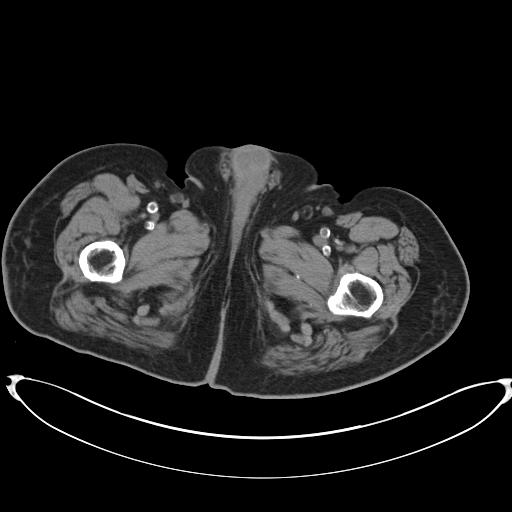
[im 5/94  bone]
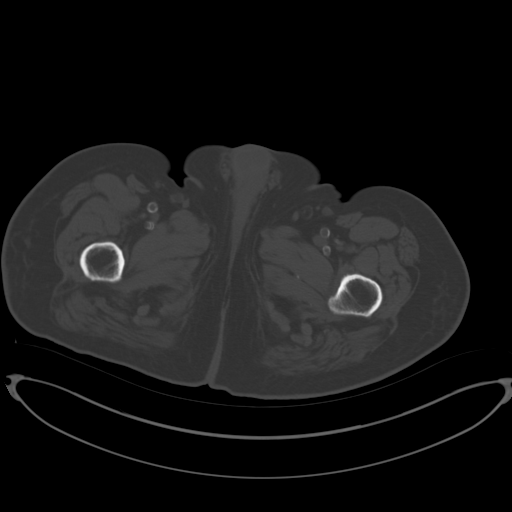
[im 14/94  soft-tissue]
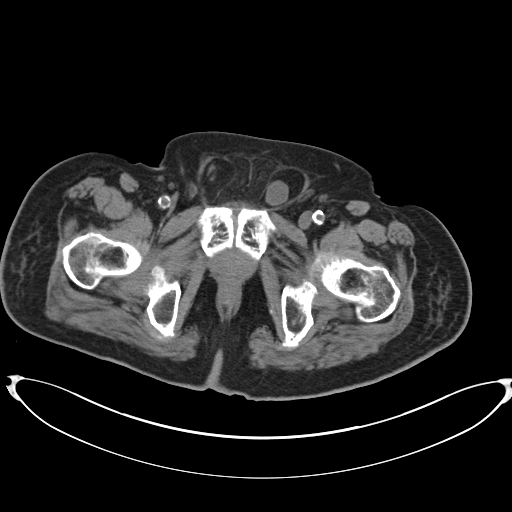
[im 19/94  soft-tissue]
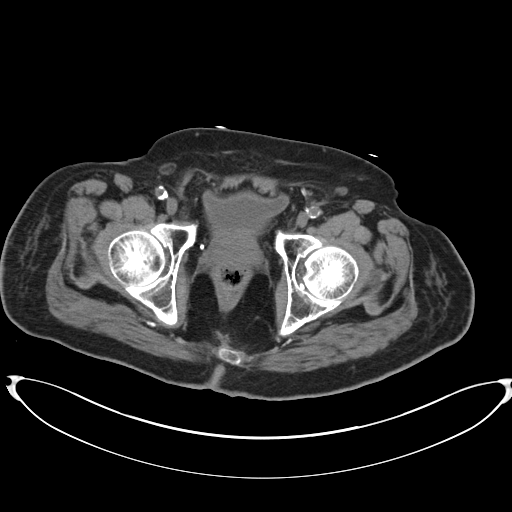
[im 28/94  soft-tissue]
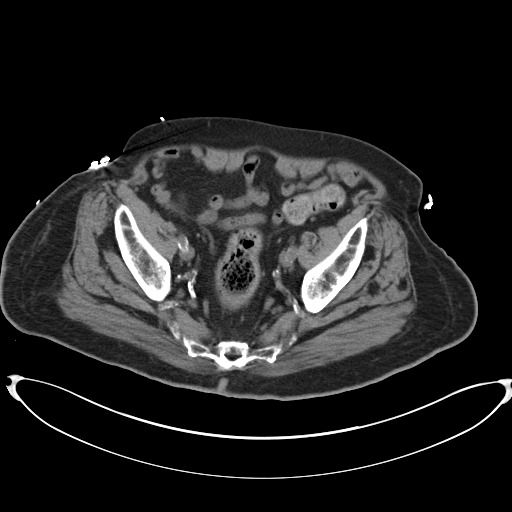
[im 33/94  soft-tissue]
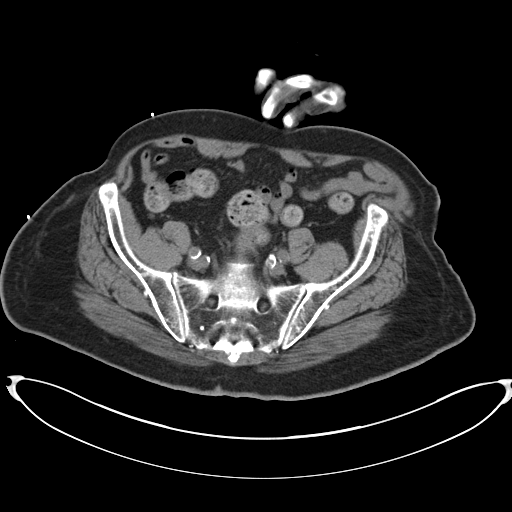
[im 42/94  soft-tissue]
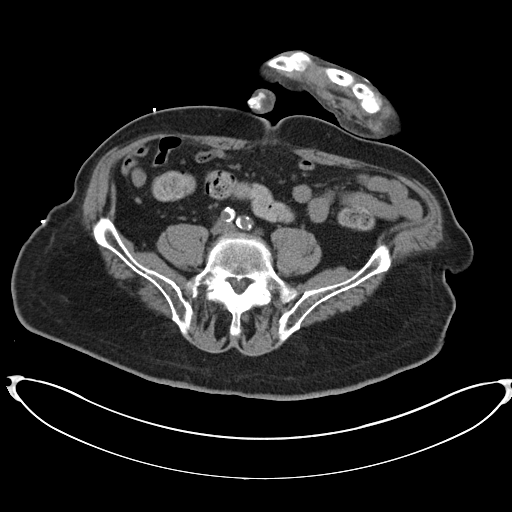
[im 47/94  soft-tissue]
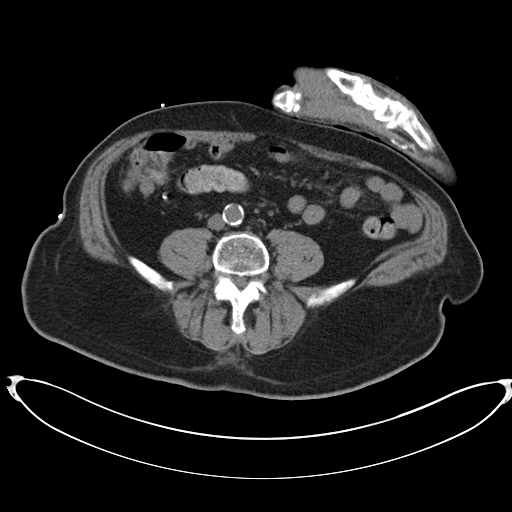
[im 52/94  soft-tissue]
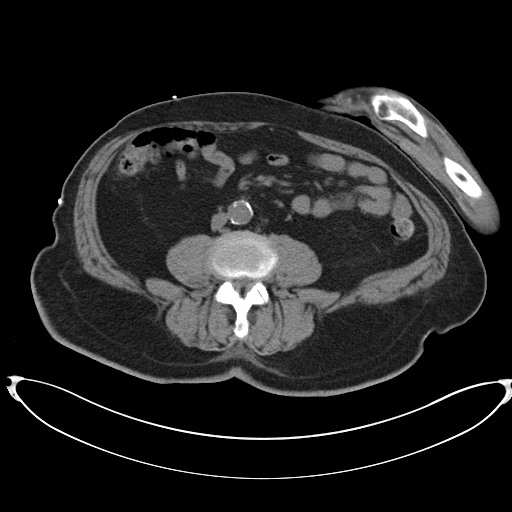
[im 61/94  soft-tissue]
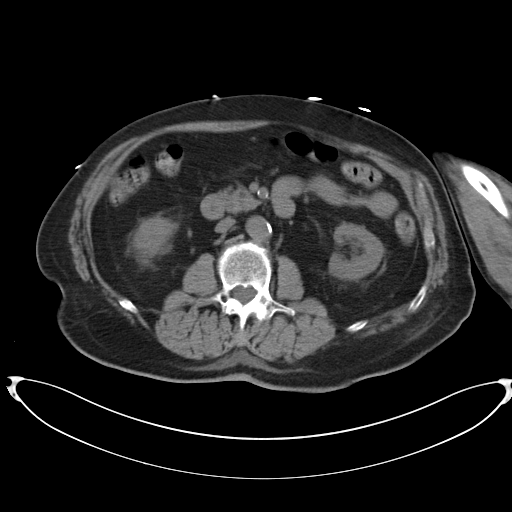
[im 61/94  bone]
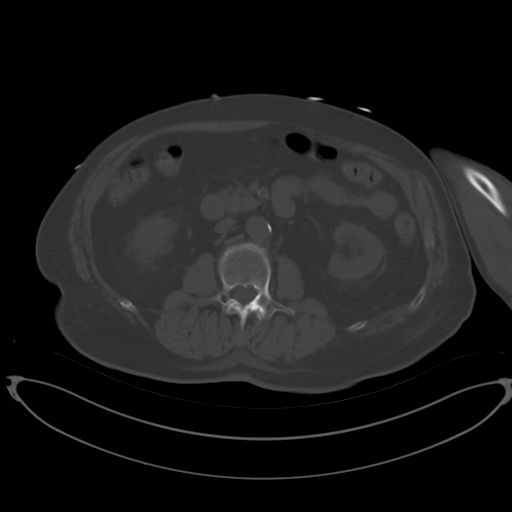
[im 66/94  soft-tissue]
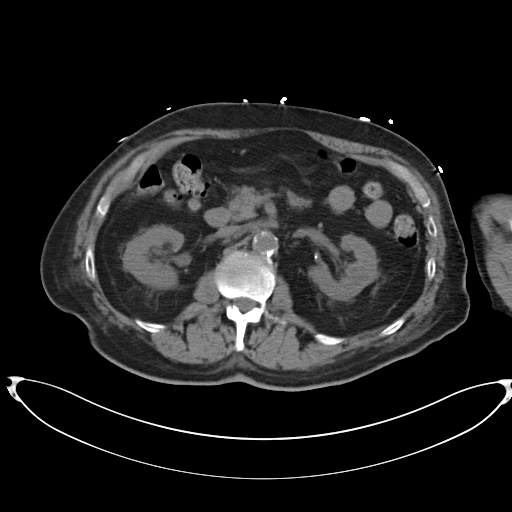
[im 75/94  soft-tissue]
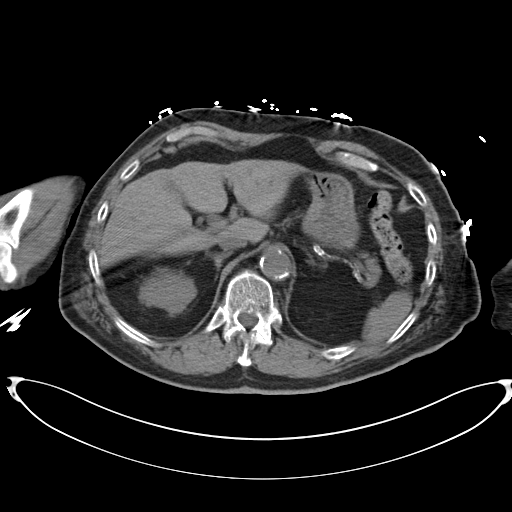
[im 80/94  soft-tissue]
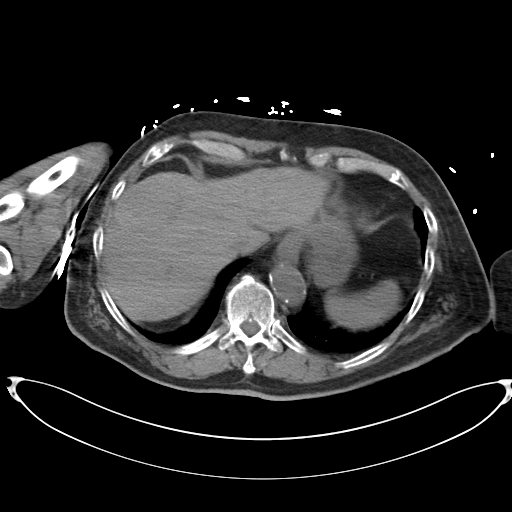
[im 89/94  soft-tissue]
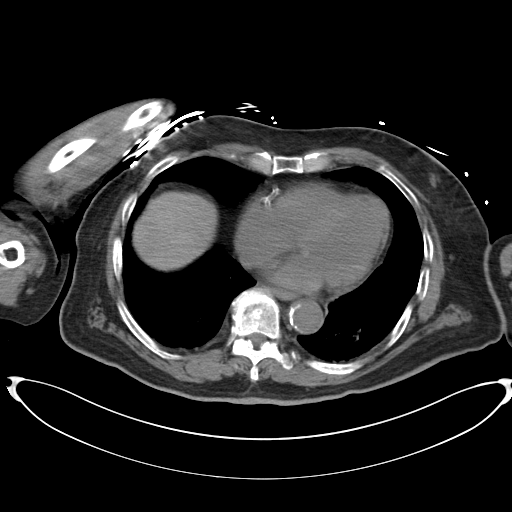

[Series 3: coronal · coronal · 0.74mm/px · 3 of 190 slices shown]
[im 64/190  soft-tissue]
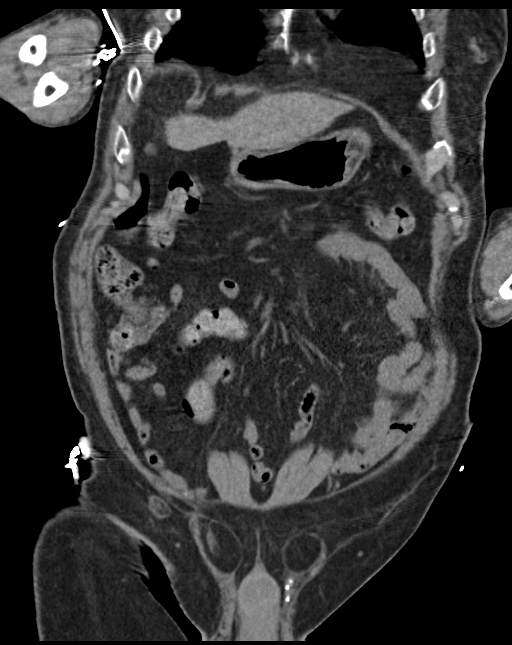
[im 85/190  soft-tissue]
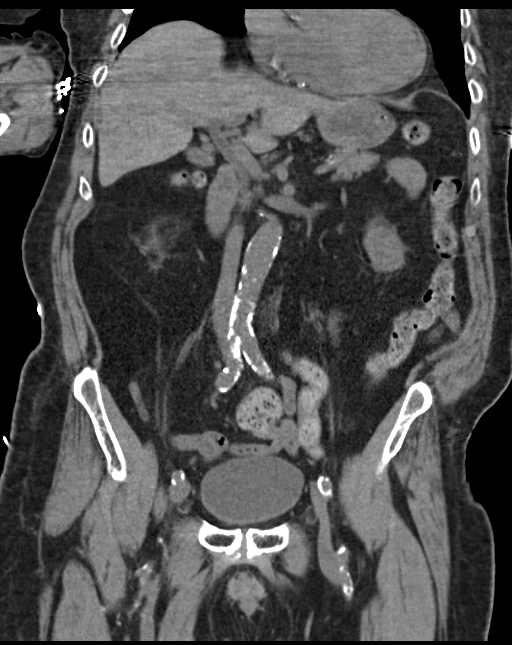
[im 106/190  soft-tissue]
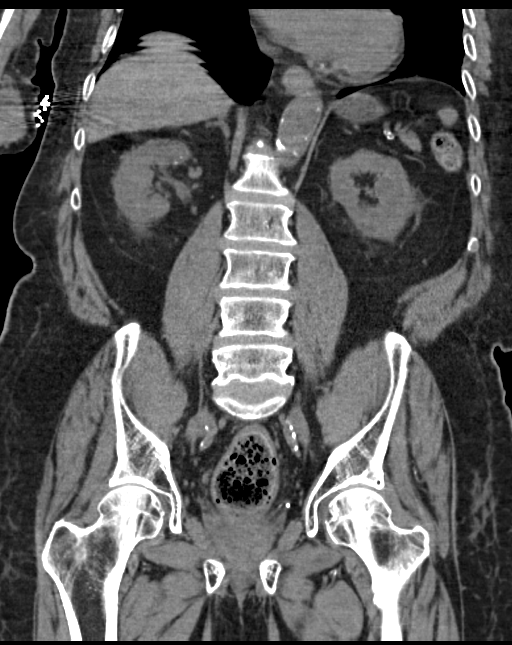

[16 of 46 positions shown; findings below may reference images not displayed]

FINDINGS: Lower chest: Minor bibasilar atelectasis. Coronary artery
calcifications.

Hepatobiliary: No focal liver abnormality is seen. No gallstones,
gallbladder wall thickening, or biliary dilatation.

Pancreas: Unremarkable. No pancreatic ductal dilatation or
surrounding inflammatory changes.

Spleen: Normal in size without focal abnormality.

Adrenals/Urinary Tract: Calcifications in right renal hilum are
probably vascular. Left kidney interpolar fluid attenuating
well-circumscribed focus measuring 23 mm is likely a cyst. No
urinary stone disease or obstructive uropathy. There are bilateral
inguinal hernias of which the left contains a portion of the
bladder.

Stomach/Bowel: Scattered sigmoid diverticulosis. No evidence for
diverticulitis. Normal appendix. No inflammatory or obstructive
changes of the small and large bowel. Tiny hiatal hernia.

Vascular/Lymphatic: Aortic atherosclerosis. No enlarged abdominal or
pelvic lymph nodes.

Reproductive: Enlarged prostate.

Other: No ascites.

Musculoskeletal: Mild osteoarthrosis of the hips with superolateral
acetabular fibrocystic changes. L4 vertebral body bone island. Mild
lumbar spondylosis.
IMPRESSION: 1. No acute process identified.
2. Bilateral inguinal hernias of which the left contains a portion
of the bladder.
3. Aortic atherosclerosis.
4. Prostate hypertrophy.
5. Scattered sigmoid diverticulosis without evidence for
diverticulitis.

By: Rudra Oommen M.D.

## 2017-12-26 DIAGNOSIS — E1159 Type 2 diabetes mellitus with other circulatory complications: Secondary | ICD-10-CM | POA: Diagnosis not present

## 2017-12-26 DIAGNOSIS — N184 Chronic kidney disease, stage 4 (severe): Secondary | ICD-10-CM | POA: Diagnosis not present

## 2017-12-26 DIAGNOSIS — I672 Cerebral atherosclerosis: Secondary | ICD-10-CM | POA: Diagnosis not present

## 2017-12-26 DIAGNOSIS — I25810 Atherosclerosis of coronary artery bypass graft(s) without angina pectoris: Secondary | ICD-10-CM | POA: Diagnosis not present

## 2017-12-26 DIAGNOSIS — F0151 Vascular dementia with behavioral disturbance: Secondary | ICD-10-CM | POA: Diagnosis not present

## 2017-12-26 DIAGNOSIS — I679 Cerebrovascular disease, unspecified: Secondary | ICD-10-CM | POA: Diagnosis not present

## 2017-12-30 DIAGNOSIS — N184 Chronic kidney disease, stage 4 (severe): Secondary | ICD-10-CM | POA: Diagnosis not present

## 2017-12-30 DIAGNOSIS — E1159 Type 2 diabetes mellitus with other circulatory complications: Secondary | ICD-10-CM | POA: Diagnosis not present

## 2017-12-30 DIAGNOSIS — F0151 Vascular dementia with behavioral disturbance: Secondary | ICD-10-CM | POA: Diagnosis not present

## 2017-12-30 DIAGNOSIS — I679 Cerebrovascular disease, unspecified: Secondary | ICD-10-CM | POA: Diagnosis not present

## 2017-12-30 DIAGNOSIS — I25810 Atherosclerosis of coronary artery bypass graft(s) without angina pectoris: Secondary | ICD-10-CM | POA: Diagnosis not present

## 2017-12-30 DIAGNOSIS — I672 Cerebral atherosclerosis: Secondary | ICD-10-CM | POA: Diagnosis not present

## 2017-12-31 DIAGNOSIS — N184 Chronic kidney disease, stage 4 (severe): Secondary | ICD-10-CM | POA: Diagnosis not present

## 2017-12-31 DIAGNOSIS — I672 Cerebral atherosclerosis: Secondary | ICD-10-CM | POA: Diagnosis not present

## 2017-12-31 DIAGNOSIS — E1159 Type 2 diabetes mellitus with other circulatory complications: Secondary | ICD-10-CM | POA: Diagnosis not present

## 2017-12-31 DIAGNOSIS — I679 Cerebrovascular disease, unspecified: Secondary | ICD-10-CM | POA: Diagnosis not present

## 2017-12-31 DIAGNOSIS — I25810 Atherosclerosis of coronary artery bypass graft(s) without angina pectoris: Secondary | ICD-10-CM | POA: Diagnosis not present

## 2017-12-31 DIAGNOSIS — F0151 Vascular dementia with behavioral disturbance: Secondary | ICD-10-CM | POA: Diagnosis not present

## 2018-01-02 DIAGNOSIS — I25810 Atherosclerosis of coronary artery bypass graft(s) without angina pectoris: Secondary | ICD-10-CM | POA: Diagnosis not present

## 2018-01-02 DIAGNOSIS — I672 Cerebral atherosclerosis: Secondary | ICD-10-CM | POA: Diagnosis not present

## 2018-01-02 DIAGNOSIS — F0151 Vascular dementia with behavioral disturbance: Secondary | ICD-10-CM | POA: Diagnosis not present

## 2018-01-02 DIAGNOSIS — E1159 Type 2 diabetes mellitus with other circulatory complications: Secondary | ICD-10-CM | POA: Diagnosis not present

## 2018-01-02 DIAGNOSIS — N184 Chronic kidney disease, stage 4 (severe): Secondary | ICD-10-CM | POA: Diagnosis not present

## 2018-01-02 DIAGNOSIS — I679 Cerebrovascular disease, unspecified: Secondary | ICD-10-CM | POA: Diagnosis not present

## 2018-01-06 DIAGNOSIS — F0151 Vascular dementia with behavioral disturbance: Secondary | ICD-10-CM | POA: Diagnosis not present

## 2018-01-06 DIAGNOSIS — N184 Chronic kidney disease, stage 4 (severe): Secondary | ICD-10-CM | POA: Diagnosis not present

## 2018-01-06 DIAGNOSIS — I672 Cerebral atherosclerosis: Secondary | ICD-10-CM | POA: Diagnosis not present

## 2018-01-06 DIAGNOSIS — E1159 Type 2 diabetes mellitus with other circulatory complications: Secondary | ICD-10-CM | POA: Diagnosis not present

## 2018-01-06 DIAGNOSIS — I25810 Atherosclerosis of coronary artery bypass graft(s) without angina pectoris: Secondary | ICD-10-CM | POA: Diagnosis not present

## 2018-01-06 DIAGNOSIS — I679 Cerebrovascular disease, unspecified: Secondary | ICD-10-CM | POA: Diagnosis not present

## 2018-01-09 DIAGNOSIS — I25810 Atherosclerosis of coronary artery bypass graft(s) without angina pectoris: Secondary | ICD-10-CM | POA: Diagnosis not present

## 2018-01-09 DIAGNOSIS — E1159 Type 2 diabetes mellitus with other circulatory complications: Secondary | ICD-10-CM | POA: Diagnosis not present

## 2018-01-09 DIAGNOSIS — I679 Cerebrovascular disease, unspecified: Secondary | ICD-10-CM | POA: Diagnosis not present

## 2018-01-09 DIAGNOSIS — N184 Chronic kidney disease, stage 4 (severe): Secondary | ICD-10-CM | POA: Diagnosis not present

## 2018-01-09 DIAGNOSIS — F0151 Vascular dementia with behavioral disturbance: Secondary | ICD-10-CM | POA: Diagnosis not present

## 2018-01-09 DIAGNOSIS — I672 Cerebral atherosclerosis: Secondary | ICD-10-CM | POA: Diagnosis not present

## 2018-01-12 DIAGNOSIS — I679 Cerebrovascular disease, unspecified: Secondary | ICD-10-CM | POA: Diagnosis not present

## 2018-01-12 DIAGNOSIS — I672 Cerebral atherosclerosis: Secondary | ICD-10-CM | POA: Diagnosis not present

## 2018-01-12 DIAGNOSIS — J309 Allergic rhinitis, unspecified: Secondary | ICD-10-CM | POA: Diagnosis not present

## 2018-01-12 DIAGNOSIS — I10 Essential (primary) hypertension: Secondary | ICD-10-CM | POA: Diagnosis not present

## 2018-01-12 DIAGNOSIS — I25810 Atherosclerosis of coronary artery bypass graft(s) without angina pectoris: Secondary | ICD-10-CM | POA: Diagnosis not present

## 2018-01-12 DIAGNOSIS — R54 Age-related physical debility: Secondary | ICD-10-CM | POA: Diagnosis not present

## 2018-01-12 DIAGNOSIS — R4701 Aphasia: Secondary | ICD-10-CM | POA: Diagnosis not present

## 2018-01-12 DIAGNOSIS — E039 Hypothyroidism, unspecified: Secondary | ICD-10-CM | POA: Diagnosis not present

## 2018-01-12 DIAGNOSIS — Z8546 Personal history of malignant neoplasm of prostate: Secondary | ICD-10-CM | POA: Diagnosis not present

## 2018-01-12 DIAGNOSIS — E1159 Type 2 diabetes mellitus with other circulatory complications: Secondary | ICD-10-CM | POA: Diagnosis not present

## 2018-01-12 DIAGNOSIS — F0151 Vascular dementia with behavioral disturbance: Secondary | ICD-10-CM | POA: Diagnosis not present

## 2018-01-12 DIAGNOSIS — M109 Gout, unspecified: Secondary | ICD-10-CM | POA: Diagnosis not present

## 2018-01-12 DIAGNOSIS — N184 Chronic kidney disease, stage 4 (severe): Secondary | ICD-10-CM | POA: Diagnosis not present

## 2018-01-12 DIAGNOSIS — R1310 Dysphagia, unspecified: Secondary | ICD-10-CM | POA: Diagnosis not present

## 2018-01-12 DIAGNOSIS — E785 Hyperlipidemia, unspecified: Secondary | ICD-10-CM | POA: Diagnosis not present

## 2018-01-13 DIAGNOSIS — F0151 Vascular dementia with behavioral disturbance: Secondary | ICD-10-CM | POA: Diagnosis not present

## 2018-01-13 DIAGNOSIS — E1159 Type 2 diabetes mellitus with other circulatory complications: Secondary | ICD-10-CM | POA: Diagnosis not present

## 2018-01-13 DIAGNOSIS — I672 Cerebral atherosclerosis: Secondary | ICD-10-CM | POA: Diagnosis not present

## 2018-01-13 DIAGNOSIS — I679 Cerebrovascular disease, unspecified: Secondary | ICD-10-CM | POA: Diagnosis not present

## 2018-01-13 DIAGNOSIS — I25810 Atherosclerosis of coronary artery bypass graft(s) without angina pectoris: Secondary | ICD-10-CM | POA: Diagnosis not present

## 2018-01-13 DIAGNOSIS — N184 Chronic kidney disease, stage 4 (severe): Secondary | ICD-10-CM | POA: Diagnosis not present

## 2018-01-16 DIAGNOSIS — E1159 Type 2 diabetes mellitus with other circulatory complications: Secondary | ICD-10-CM | POA: Diagnosis not present

## 2018-01-16 DIAGNOSIS — N184 Chronic kidney disease, stage 4 (severe): Secondary | ICD-10-CM | POA: Diagnosis not present

## 2018-01-16 DIAGNOSIS — I679 Cerebrovascular disease, unspecified: Secondary | ICD-10-CM | POA: Diagnosis not present

## 2018-01-16 DIAGNOSIS — F0151 Vascular dementia with behavioral disturbance: Secondary | ICD-10-CM | POA: Diagnosis not present

## 2018-01-16 DIAGNOSIS — I672 Cerebral atherosclerosis: Secondary | ICD-10-CM | POA: Diagnosis not present

## 2018-01-16 DIAGNOSIS — I25810 Atherosclerosis of coronary artery bypass graft(s) without angina pectoris: Secondary | ICD-10-CM | POA: Diagnosis not present

## 2018-01-20 DIAGNOSIS — N184 Chronic kidney disease, stage 4 (severe): Secondary | ICD-10-CM | POA: Diagnosis not present

## 2018-01-20 DIAGNOSIS — I679 Cerebrovascular disease, unspecified: Secondary | ICD-10-CM | POA: Diagnosis not present

## 2018-01-20 DIAGNOSIS — E1159 Type 2 diabetes mellitus with other circulatory complications: Secondary | ICD-10-CM | POA: Diagnosis not present

## 2018-01-20 DIAGNOSIS — I672 Cerebral atherosclerosis: Secondary | ICD-10-CM | POA: Diagnosis not present

## 2018-01-20 DIAGNOSIS — I25810 Atherosclerosis of coronary artery bypass graft(s) without angina pectoris: Secondary | ICD-10-CM | POA: Diagnosis not present

## 2018-01-20 DIAGNOSIS — F0151 Vascular dementia with behavioral disturbance: Secondary | ICD-10-CM | POA: Diagnosis not present

## 2018-01-22 DIAGNOSIS — I672 Cerebral atherosclerosis: Secondary | ICD-10-CM | POA: Diagnosis not present

## 2018-01-22 DIAGNOSIS — N184 Chronic kidney disease, stage 4 (severe): Secondary | ICD-10-CM | POA: Diagnosis not present

## 2018-01-22 DIAGNOSIS — I679 Cerebrovascular disease, unspecified: Secondary | ICD-10-CM | POA: Diagnosis not present

## 2018-01-22 DIAGNOSIS — I25810 Atherosclerosis of coronary artery bypass graft(s) without angina pectoris: Secondary | ICD-10-CM | POA: Diagnosis not present

## 2018-01-22 DIAGNOSIS — E1159 Type 2 diabetes mellitus with other circulatory complications: Secondary | ICD-10-CM | POA: Diagnosis not present

## 2018-01-22 DIAGNOSIS — F0151 Vascular dementia with behavioral disturbance: Secondary | ICD-10-CM | POA: Diagnosis not present

## 2018-01-23 DIAGNOSIS — I679 Cerebrovascular disease, unspecified: Secondary | ICD-10-CM | POA: Diagnosis not present

## 2018-01-23 DIAGNOSIS — E1159 Type 2 diabetes mellitus with other circulatory complications: Secondary | ICD-10-CM | POA: Diagnosis not present

## 2018-01-23 DIAGNOSIS — F0151 Vascular dementia with behavioral disturbance: Secondary | ICD-10-CM | POA: Diagnosis not present

## 2018-01-23 DIAGNOSIS — I25810 Atherosclerosis of coronary artery bypass graft(s) without angina pectoris: Secondary | ICD-10-CM | POA: Diagnosis not present

## 2018-01-23 DIAGNOSIS — N184 Chronic kidney disease, stage 4 (severe): Secondary | ICD-10-CM | POA: Diagnosis not present

## 2018-01-23 DIAGNOSIS — I672 Cerebral atherosclerosis: Secondary | ICD-10-CM | POA: Diagnosis not present

## 2018-01-27 DIAGNOSIS — I679 Cerebrovascular disease, unspecified: Secondary | ICD-10-CM | POA: Diagnosis not present

## 2018-01-27 DIAGNOSIS — N184 Chronic kidney disease, stage 4 (severe): Secondary | ICD-10-CM | POA: Diagnosis not present

## 2018-01-27 DIAGNOSIS — E1159 Type 2 diabetes mellitus with other circulatory complications: Secondary | ICD-10-CM | POA: Diagnosis not present

## 2018-01-27 DIAGNOSIS — I672 Cerebral atherosclerosis: Secondary | ICD-10-CM | POA: Diagnosis not present

## 2018-01-27 DIAGNOSIS — I25810 Atherosclerosis of coronary artery bypass graft(s) without angina pectoris: Secondary | ICD-10-CM | POA: Diagnosis not present

## 2018-01-27 DIAGNOSIS — F0151 Vascular dementia with behavioral disturbance: Secondary | ICD-10-CM | POA: Diagnosis not present

## 2018-01-30 DIAGNOSIS — E1159 Type 2 diabetes mellitus with other circulatory complications: Secondary | ICD-10-CM | POA: Diagnosis not present

## 2018-01-30 DIAGNOSIS — F0151 Vascular dementia with behavioral disturbance: Secondary | ICD-10-CM | POA: Diagnosis not present

## 2018-01-30 DIAGNOSIS — I679 Cerebrovascular disease, unspecified: Secondary | ICD-10-CM | POA: Diagnosis not present

## 2018-01-30 DIAGNOSIS — N184 Chronic kidney disease, stage 4 (severe): Secondary | ICD-10-CM | POA: Diagnosis not present

## 2018-01-30 DIAGNOSIS — I672 Cerebral atherosclerosis: Secondary | ICD-10-CM | POA: Diagnosis not present

## 2018-01-30 DIAGNOSIS — I25810 Atherosclerosis of coronary artery bypass graft(s) without angina pectoris: Secondary | ICD-10-CM | POA: Diagnosis not present

## 2018-02-03 DIAGNOSIS — I679 Cerebrovascular disease, unspecified: Secondary | ICD-10-CM | POA: Diagnosis not present

## 2018-02-03 DIAGNOSIS — I25810 Atherosclerosis of coronary artery bypass graft(s) without angina pectoris: Secondary | ICD-10-CM | POA: Diagnosis not present

## 2018-02-03 DIAGNOSIS — E1159 Type 2 diabetes mellitus with other circulatory complications: Secondary | ICD-10-CM | POA: Diagnosis not present

## 2018-02-03 DIAGNOSIS — N184 Chronic kidney disease, stage 4 (severe): Secondary | ICD-10-CM | POA: Diagnosis not present

## 2018-02-03 DIAGNOSIS — F0151 Vascular dementia with behavioral disturbance: Secondary | ICD-10-CM | POA: Diagnosis not present

## 2018-02-03 DIAGNOSIS — I672 Cerebral atherosclerosis: Secondary | ICD-10-CM | POA: Diagnosis not present

## 2018-02-04 DIAGNOSIS — I679 Cerebrovascular disease, unspecified: Secondary | ICD-10-CM | POA: Diagnosis not present

## 2018-02-04 DIAGNOSIS — N184 Chronic kidney disease, stage 4 (severe): Secondary | ICD-10-CM | POA: Diagnosis not present

## 2018-02-04 DIAGNOSIS — I672 Cerebral atherosclerosis: Secondary | ICD-10-CM | POA: Diagnosis not present

## 2018-02-04 DIAGNOSIS — E1159 Type 2 diabetes mellitus with other circulatory complications: Secondary | ICD-10-CM | POA: Diagnosis not present

## 2018-02-04 DIAGNOSIS — I25810 Atherosclerosis of coronary artery bypass graft(s) without angina pectoris: Secondary | ICD-10-CM | POA: Diagnosis not present

## 2018-02-04 DIAGNOSIS — F0151 Vascular dementia with behavioral disturbance: Secondary | ICD-10-CM | POA: Diagnosis not present

## 2018-02-06 DIAGNOSIS — N184 Chronic kidney disease, stage 4 (severe): Secondary | ICD-10-CM | POA: Diagnosis not present

## 2018-02-06 DIAGNOSIS — I25810 Atherosclerosis of coronary artery bypass graft(s) without angina pectoris: Secondary | ICD-10-CM | POA: Diagnosis not present

## 2018-02-06 DIAGNOSIS — E1159 Type 2 diabetes mellitus with other circulatory complications: Secondary | ICD-10-CM | POA: Diagnosis not present

## 2018-02-06 DIAGNOSIS — I672 Cerebral atherosclerosis: Secondary | ICD-10-CM | POA: Diagnosis not present

## 2018-02-06 DIAGNOSIS — I679 Cerebrovascular disease, unspecified: Secondary | ICD-10-CM | POA: Diagnosis not present

## 2018-02-06 DIAGNOSIS — F0151 Vascular dementia with behavioral disturbance: Secondary | ICD-10-CM | POA: Diagnosis not present

## 2018-02-08 DIAGNOSIS — N184 Chronic kidney disease, stage 4 (severe): Secondary | ICD-10-CM | POA: Diagnosis not present

## 2018-02-08 DIAGNOSIS — I672 Cerebral atherosclerosis: Secondary | ICD-10-CM | POA: Diagnosis not present

## 2018-02-08 DIAGNOSIS — F0151 Vascular dementia with behavioral disturbance: Secondary | ICD-10-CM | POA: Diagnosis not present

## 2018-02-08 DIAGNOSIS — I679 Cerebrovascular disease, unspecified: Secondary | ICD-10-CM | POA: Diagnosis not present

## 2018-02-08 DIAGNOSIS — I25810 Atherosclerosis of coronary artery bypass graft(s) without angina pectoris: Secondary | ICD-10-CM | POA: Diagnosis not present

## 2018-02-08 DIAGNOSIS — E1159 Type 2 diabetes mellitus with other circulatory complications: Secondary | ICD-10-CM | POA: Diagnosis not present

## 2018-02-10 DIAGNOSIS — I672 Cerebral atherosclerosis: Secondary | ICD-10-CM | POA: Diagnosis not present

## 2018-02-10 DIAGNOSIS — N184 Chronic kidney disease, stage 4 (severe): Secondary | ICD-10-CM | POA: Diagnosis not present

## 2018-02-10 DIAGNOSIS — F0151 Vascular dementia with behavioral disturbance: Secondary | ICD-10-CM | POA: Diagnosis not present

## 2018-02-10 DIAGNOSIS — R05 Cough: Secondary | ICD-10-CM | POA: Diagnosis not present

## 2018-02-10 DIAGNOSIS — I679 Cerebrovascular disease, unspecified: Secondary | ICD-10-CM | POA: Diagnosis not present

## 2018-02-10 DIAGNOSIS — E1159 Type 2 diabetes mellitus with other circulatory complications: Secondary | ICD-10-CM | POA: Diagnosis not present

## 2018-02-10 DIAGNOSIS — I25810 Atherosclerosis of coronary artery bypass graft(s) without angina pectoris: Secondary | ICD-10-CM | POA: Diagnosis not present

## 2018-02-11 DIAGNOSIS — F0151 Vascular dementia with behavioral disturbance: Secondary | ICD-10-CM | POA: Diagnosis not present

## 2018-02-11 DIAGNOSIS — E1159 Type 2 diabetes mellitus with other circulatory complications: Secondary | ICD-10-CM | POA: Diagnosis not present

## 2018-02-11 DIAGNOSIS — E785 Hyperlipidemia, unspecified: Secondary | ICD-10-CM | POA: Diagnosis not present

## 2018-02-11 DIAGNOSIS — R4701 Aphasia: Secondary | ICD-10-CM | POA: Diagnosis not present

## 2018-02-11 DIAGNOSIS — J309 Allergic rhinitis, unspecified: Secondary | ICD-10-CM | POA: Diagnosis not present

## 2018-02-11 DIAGNOSIS — M109 Gout, unspecified: Secondary | ICD-10-CM | POA: Diagnosis not present

## 2018-02-11 DIAGNOSIS — I672 Cerebral atherosclerosis: Secondary | ICD-10-CM | POA: Diagnosis not present

## 2018-02-11 DIAGNOSIS — N184 Chronic kidney disease, stage 4 (severe): Secondary | ICD-10-CM | POA: Diagnosis not present

## 2018-02-11 DIAGNOSIS — R1310 Dysphagia, unspecified: Secondary | ICD-10-CM | POA: Diagnosis not present

## 2018-02-11 DIAGNOSIS — E039 Hypothyroidism, unspecified: Secondary | ICD-10-CM | POA: Diagnosis not present

## 2018-02-11 DIAGNOSIS — I679 Cerebrovascular disease, unspecified: Secondary | ICD-10-CM | POA: Diagnosis not present

## 2018-02-11 DIAGNOSIS — I10 Essential (primary) hypertension: Secondary | ICD-10-CM | POA: Diagnosis not present

## 2018-02-11 DIAGNOSIS — Z8546 Personal history of malignant neoplasm of prostate: Secondary | ICD-10-CM | POA: Diagnosis not present

## 2018-02-11 DIAGNOSIS — R54 Age-related physical debility: Secondary | ICD-10-CM | POA: Diagnosis not present

## 2018-02-11 DIAGNOSIS — I25810 Atherosclerosis of coronary artery bypass graft(s) without angina pectoris: Secondary | ICD-10-CM | POA: Diagnosis not present

## 2018-02-12 DIAGNOSIS — N184 Chronic kidney disease, stage 4 (severe): Secondary | ICD-10-CM | POA: Diagnosis not present

## 2018-02-12 DIAGNOSIS — E1159 Type 2 diabetes mellitus with other circulatory complications: Secondary | ICD-10-CM | POA: Diagnosis not present

## 2018-02-12 DIAGNOSIS — I25810 Atherosclerosis of coronary artery bypass graft(s) without angina pectoris: Secondary | ICD-10-CM | POA: Diagnosis not present

## 2018-02-12 DIAGNOSIS — I672 Cerebral atherosclerosis: Secondary | ICD-10-CM | POA: Diagnosis not present

## 2018-02-12 DIAGNOSIS — F0151 Vascular dementia with behavioral disturbance: Secondary | ICD-10-CM | POA: Diagnosis not present

## 2018-02-12 DIAGNOSIS — I679 Cerebrovascular disease, unspecified: Secondary | ICD-10-CM | POA: Diagnosis not present

## 2018-02-13 DIAGNOSIS — I25810 Atherosclerosis of coronary artery bypass graft(s) without angina pectoris: Secondary | ICD-10-CM | POA: Diagnosis not present

## 2018-02-13 DIAGNOSIS — N184 Chronic kidney disease, stage 4 (severe): Secondary | ICD-10-CM | POA: Diagnosis not present

## 2018-02-13 DIAGNOSIS — I672 Cerebral atherosclerosis: Secondary | ICD-10-CM | POA: Diagnosis not present

## 2018-02-13 DIAGNOSIS — F0151 Vascular dementia with behavioral disturbance: Secondary | ICD-10-CM | POA: Diagnosis not present

## 2018-02-13 DIAGNOSIS — E1159 Type 2 diabetes mellitus with other circulatory complications: Secondary | ICD-10-CM | POA: Diagnosis not present

## 2018-02-13 DIAGNOSIS — I679 Cerebrovascular disease, unspecified: Secondary | ICD-10-CM | POA: Diagnosis not present

## 2018-02-16 DIAGNOSIS — E1159 Type 2 diabetes mellitus with other circulatory complications: Secondary | ICD-10-CM | POA: Diagnosis not present

## 2018-02-16 DIAGNOSIS — I679 Cerebrovascular disease, unspecified: Secondary | ICD-10-CM | POA: Diagnosis not present

## 2018-02-16 DIAGNOSIS — N184 Chronic kidney disease, stage 4 (severe): Secondary | ICD-10-CM | POA: Diagnosis not present

## 2018-02-16 DIAGNOSIS — I25810 Atherosclerosis of coronary artery bypass graft(s) without angina pectoris: Secondary | ICD-10-CM | POA: Diagnosis not present

## 2018-02-16 DIAGNOSIS — F0151 Vascular dementia with behavioral disturbance: Secondary | ICD-10-CM | POA: Diagnosis not present

## 2018-02-16 DIAGNOSIS — I672 Cerebral atherosclerosis: Secondary | ICD-10-CM | POA: Diagnosis not present

## 2018-02-17 DIAGNOSIS — F0151 Vascular dementia with behavioral disturbance: Secondary | ICD-10-CM | POA: Diagnosis not present

## 2018-02-17 DIAGNOSIS — I672 Cerebral atherosclerosis: Secondary | ICD-10-CM | POA: Diagnosis not present

## 2018-02-17 DIAGNOSIS — E1159 Type 2 diabetes mellitus with other circulatory complications: Secondary | ICD-10-CM | POA: Diagnosis not present

## 2018-02-17 DIAGNOSIS — N184 Chronic kidney disease, stage 4 (severe): Secondary | ICD-10-CM | POA: Diagnosis not present

## 2018-02-17 DIAGNOSIS — I679 Cerebrovascular disease, unspecified: Secondary | ICD-10-CM | POA: Diagnosis not present

## 2018-02-17 DIAGNOSIS — I25810 Atherosclerosis of coronary artery bypass graft(s) without angina pectoris: Secondary | ICD-10-CM | POA: Diagnosis not present

## 2018-02-24 DIAGNOSIS — I25810 Atherosclerosis of coronary artery bypass graft(s) without angina pectoris: Secondary | ICD-10-CM | POA: Diagnosis not present

## 2018-02-24 DIAGNOSIS — I672 Cerebral atherosclerosis: Secondary | ICD-10-CM | POA: Diagnosis not present

## 2018-02-24 DIAGNOSIS — I679 Cerebrovascular disease, unspecified: Secondary | ICD-10-CM | POA: Diagnosis not present

## 2018-02-24 DIAGNOSIS — E1159 Type 2 diabetes mellitus with other circulatory complications: Secondary | ICD-10-CM | POA: Diagnosis not present

## 2018-02-24 DIAGNOSIS — F0151 Vascular dementia with behavioral disturbance: Secondary | ICD-10-CM | POA: Diagnosis not present

## 2018-02-24 DIAGNOSIS — N184 Chronic kidney disease, stage 4 (severe): Secondary | ICD-10-CM | POA: Diagnosis not present

## 2018-02-26 DIAGNOSIS — N184 Chronic kidney disease, stage 4 (severe): Secondary | ICD-10-CM | POA: Diagnosis not present

## 2018-02-26 DIAGNOSIS — E1159 Type 2 diabetes mellitus with other circulatory complications: Secondary | ICD-10-CM | POA: Diagnosis not present

## 2018-02-26 DIAGNOSIS — I679 Cerebrovascular disease, unspecified: Secondary | ICD-10-CM | POA: Diagnosis not present

## 2018-02-26 DIAGNOSIS — I672 Cerebral atherosclerosis: Secondary | ICD-10-CM | POA: Diagnosis not present

## 2018-02-26 DIAGNOSIS — F0151 Vascular dementia with behavioral disturbance: Secondary | ICD-10-CM | POA: Diagnosis not present

## 2018-02-26 DIAGNOSIS — I25810 Atherosclerosis of coronary artery bypass graft(s) without angina pectoris: Secondary | ICD-10-CM | POA: Diagnosis not present

## 2018-03-02 DIAGNOSIS — I672 Cerebral atherosclerosis: Secondary | ICD-10-CM | POA: Diagnosis not present

## 2018-03-02 DIAGNOSIS — F0151 Vascular dementia with behavioral disturbance: Secondary | ICD-10-CM | POA: Diagnosis not present

## 2018-03-02 DIAGNOSIS — I679 Cerebrovascular disease, unspecified: Secondary | ICD-10-CM | POA: Diagnosis not present

## 2018-03-02 DIAGNOSIS — N184 Chronic kidney disease, stage 4 (severe): Secondary | ICD-10-CM | POA: Diagnosis not present

## 2018-03-02 DIAGNOSIS — E1159 Type 2 diabetes mellitus with other circulatory complications: Secondary | ICD-10-CM | POA: Diagnosis not present

## 2018-03-02 DIAGNOSIS — I25810 Atherosclerosis of coronary artery bypass graft(s) without angina pectoris: Secondary | ICD-10-CM | POA: Diagnosis not present

## 2018-03-03 DIAGNOSIS — I679 Cerebrovascular disease, unspecified: Secondary | ICD-10-CM | POA: Diagnosis not present

## 2018-03-03 DIAGNOSIS — I672 Cerebral atherosclerosis: Secondary | ICD-10-CM | POA: Diagnosis not present

## 2018-03-03 DIAGNOSIS — F0151 Vascular dementia with behavioral disturbance: Secondary | ICD-10-CM | POA: Diagnosis not present

## 2018-03-03 DIAGNOSIS — E1159 Type 2 diabetes mellitus with other circulatory complications: Secondary | ICD-10-CM | POA: Diagnosis not present

## 2018-03-03 DIAGNOSIS — N184 Chronic kidney disease, stage 4 (severe): Secondary | ICD-10-CM | POA: Diagnosis not present

## 2018-03-03 DIAGNOSIS — I25810 Atherosclerosis of coronary artery bypass graft(s) without angina pectoris: Secondary | ICD-10-CM | POA: Diagnosis not present

## 2018-03-05 DIAGNOSIS — F0151 Vascular dementia with behavioral disturbance: Secondary | ICD-10-CM | POA: Diagnosis not present

## 2018-03-05 DIAGNOSIS — E1159 Type 2 diabetes mellitus with other circulatory complications: Secondary | ICD-10-CM | POA: Diagnosis not present

## 2018-03-05 DIAGNOSIS — N184 Chronic kidney disease, stage 4 (severe): Secondary | ICD-10-CM | POA: Diagnosis not present

## 2018-03-05 DIAGNOSIS — I679 Cerebrovascular disease, unspecified: Secondary | ICD-10-CM | POA: Diagnosis not present

## 2018-03-05 DIAGNOSIS — I25810 Atherosclerosis of coronary artery bypass graft(s) without angina pectoris: Secondary | ICD-10-CM | POA: Diagnosis not present

## 2018-03-05 DIAGNOSIS — I672 Cerebral atherosclerosis: Secondary | ICD-10-CM | POA: Diagnosis not present

## 2018-03-10 DIAGNOSIS — N184 Chronic kidney disease, stage 4 (severe): Secondary | ICD-10-CM | POA: Diagnosis not present

## 2018-03-10 DIAGNOSIS — F0151 Vascular dementia with behavioral disturbance: Secondary | ICD-10-CM | POA: Diagnosis not present

## 2018-03-10 DIAGNOSIS — I679 Cerebrovascular disease, unspecified: Secondary | ICD-10-CM | POA: Diagnosis not present

## 2018-03-10 DIAGNOSIS — I672 Cerebral atherosclerosis: Secondary | ICD-10-CM | POA: Diagnosis not present

## 2018-03-10 DIAGNOSIS — I25810 Atherosclerosis of coronary artery bypass graft(s) without angina pectoris: Secondary | ICD-10-CM | POA: Diagnosis not present

## 2018-03-10 DIAGNOSIS — E1159 Type 2 diabetes mellitus with other circulatory complications: Secondary | ICD-10-CM | POA: Diagnosis not present

## 2018-03-12 DIAGNOSIS — I672 Cerebral atherosclerosis: Secondary | ICD-10-CM | POA: Diagnosis not present

## 2018-03-12 DIAGNOSIS — I25810 Atherosclerosis of coronary artery bypass graft(s) without angina pectoris: Secondary | ICD-10-CM | POA: Diagnosis not present

## 2018-03-12 DIAGNOSIS — N184 Chronic kidney disease, stage 4 (severe): Secondary | ICD-10-CM | POA: Diagnosis not present

## 2018-03-12 DIAGNOSIS — F0151 Vascular dementia with behavioral disturbance: Secondary | ICD-10-CM | POA: Diagnosis not present

## 2018-03-12 DIAGNOSIS — E1159 Type 2 diabetes mellitus with other circulatory complications: Secondary | ICD-10-CM | POA: Diagnosis not present

## 2018-03-12 DIAGNOSIS — I679 Cerebrovascular disease, unspecified: Secondary | ICD-10-CM | POA: Diagnosis not present

## 2018-03-14 DIAGNOSIS — I679 Cerebrovascular disease, unspecified: Secondary | ICD-10-CM | POA: Diagnosis not present

## 2018-03-14 DIAGNOSIS — E1159 Type 2 diabetes mellitus with other circulatory complications: Secondary | ICD-10-CM | POA: Diagnosis not present

## 2018-03-14 DIAGNOSIS — R54 Age-related physical debility: Secondary | ICD-10-CM | POA: Diagnosis not present

## 2018-03-14 DIAGNOSIS — R1310 Dysphagia, unspecified: Secondary | ICD-10-CM | POA: Diagnosis not present

## 2018-03-14 DIAGNOSIS — M109 Gout, unspecified: Secondary | ICD-10-CM | POA: Diagnosis not present

## 2018-03-14 DIAGNOSIS — E785 Hyperlipidemia, unspecified: Secondary | ICD-10-CM | POA: Diagnosis not present

## 2018-03-14 DIAGNOSIS — F0151 Vascular dementia with behavioral disturbance: Secondary | ICD-10-CM | POA: Diagnosis not present

## 2018-03-14 DIAGNOSIS — J309 Allergic rhinitis, unspecified: Secondary | ICD-10-CM | POA: Diagnosis not present

## 2018-03-14 DIAGNOSIS — I672 Cerebral atherosclerosis: Secondary | ICD-10-CM | POA: Diagnosis not present

## 2018-03-14 DIAGNOSIS — N184 Chronic kidney disease, stage 4 (severe): Secondary | ICD-10-CM | POA: Diagnosis not present

## 2018-03-14 DIAGNOSIS — R4701 Aphasia: Secondary | ICD-10-CM | POA: Diagnosis not present

## 2018-03-14 DIAGNOSIS — E039 Hypothyroidism, unspecified: Secondary | ICD-10-CM | POA: Diagnosis not present

## 2018-03-14 DIAGNOSIS — I10 Essential (primary) hypertension: Secondary | ICD-10-CM | POA: Diagnosis not present

## 2018-03-14 DIAGNOSIS — Z8546 Personal history of malignant neoplasm of prostate: Secondary | ICD-10-CM | POA: Diagnosis not present

## 2018-03-14 DIAGNOSIS — I25810 Atherosclerosis of coronary artery bypass graft(s) without angina pectoris: Secondary | ICD-10-CM | POA: Diagnosis not present

## 2018-03-15 DIAGNOSIS — I679 Cerebrovascular disease, unspecified: Secondary | ICD-10-CM | POA: Diagnosis not present

## 2018-03-15 DIAGNOSIS — I672 Cerebral atherosclerosis: Secondary | ICD-10-CM | POA: Diagnosis not present

## 2018-03-15 DIAGNOSIS — F0151 Vascular dementia with behavioral disturbance: Secondary | ICD-10-CM | POA: Diagnosis not present

## 2018-03-15 DIAGNOSIS — I25810 Atherosclerosis of coronary artery bypass graft(s) without angina pectoris: Secondary | ICD-10-CM | POA: Diagnosis not present

## 2018-03-15 DIAGNOSIS — N184 Chronic kidney disease, stage 4 (severe): Secondary | ICD-10-CM | POA: Diagnosis not present

## 2018-03-15 DIAGNOSIS — E1159 Type 2 diabetes mellitus with other circulatory complications: Secondary | ICD-10-CM | POA: Diagnosis not present

## 2018-03-17 DIAGNOSIS — E1159 Type 2 diabetes mellitus with other circulatory complications: Secondary | ICD-10-CM | POA: Diagnosis not present

## 2018-03-17 DIAGNOSIS — N184 Chronic kidney disease, stage 4 (severe): Secondary | ICD-10-CM | POA: Diagnosis not present

## 2018-03-17 DIAGNOSIS — I679 Cerebrovascular disease, unspecified: Secondary | ICD-10-CM | POA: Diagnosis not present

## 2018-03-17 DIAGNOSIS — I672 Cerebral atherosclerosis: Secondary | ICD-10-CM | POA: Diagnosis not present

## 2018-03-17 DIAGNOSIS — F0151 Vascular dementia with behavioral disturbance: Secondary | ICD-10-CM | POA: Diagnosis not present

## 2018-03-17 DIAGNOSIS — I25810 Atherosclerosis of coronary artery bypass graft(s) without angina pectoris: Secondary | ICD-10-CM | POA: Diagnosis not present

## 2018-03-18 DIAGNOSIS — N184 Chronic kidney disease, stage 4 (severe): Secondary | ICD-10-CM | POA: Diagnosis not present

## 2018-03-18 DIAGNOSIS — I25810 Atherosclerosis of coronary artery bypass graft(s) without angina pectoris: Secondary | ICD-10-CM | POA: Diagnosis not present

## 2018-03-18 DIAGNOSIS — E1159 Type 2 diabetes mellitus with other circulatory complications: Secondary | ICD-10-CM | POA: Diagnosis not present

## 2018-03-18 DIAGNOSIS — I672 Cerebral atherosclerosis: Secondary | ICD-10-CM | POA: Diagnosis not present

## 2018-03-18 DIAGNOSIS — F0151 Vascular dementia with behavioral disturbance: Secondary | ICD-10-CM | POA: Diagnosis not present

## 2018-03-18 DIAGNOSIS — I679 Cerebrovascular disease, unspecified: Secondary | ICD-10-CM | POA: Diagnosis not present

## 2018-03-19 DIAGNOSIS — I672 Cerebral atherosclerosis: Secondary | ICD-10-CM | POA: Diagnosis not present

## 2018-03-19 DIAGNOSIS — E1159 Type 2 diabetes mellitus with other circulatory complications: Secondary | ICD-10-CM | POA: Diagnosis not present

## 2018-03-19 DIAGNOSIS — N184 Chronic kidney disease, stage 4 (severe): Secondary | ICD-10-CM | POA: Diagnosis not present

## 2018-03-19 DIAGNOSIS — I679 Cerebrovascular disease, unspecified: Secondary | ICD-10-CM | POA: Diagnosis not present

## 2018-03-19 DIAGNOSIS — F0151 Vascular dementia with behavioral disturbance: Secondary | ICD-10-CM | POA: Diagnosis not present

## 2018-03-19 DIAGNOSIS — I25810 Atherosclerosis of coronary artery bypass graft(s) without angina pectoris: Secondary | ICD-10-CM | POA: Diagnosis not present

## 2018-03-22 DIAGNOSIS — I679 Cerebrovascular disease, unspecified: Secondary | ICD-10-CM | POA: Diagnosis not present

## 2018-03-22 DIAGNOSIS — N184 Chronic kidney disease, stage 4 (severe): Secondary | ICD-10-CM | POA: Diagnosis not present

## 2018-03-22 DIAGNOSIS — I25810 Atherosclerosis of coronary artery bypass graft(s) without angina pectoris: Secondary | ICD-10-CM | POA: Diagnosis not present

## 2018-03-22 DIAGNOSIS — I672 Cerebral atherosclerosis: Secondary | ICD-10-CM | POA: Diagnosis not present

## 2018-03-22 DIAGNOSIS — F0151 Vascular dementia with behavioral disturbance: Secondary | ICD-10-CM | POA: Diagnosis not present

## 2018-03-22 DIAGNOSIS — E1159 Type 2 diabetes mellitus with other circulatory complications: Secondary | ICD-10-CM | POA: Diagnosis not present

## 2018-03-26 DIAGNOSIS — N184 Chronic kidney disease, stage 4 (severe): Secondary | ICD-10-CM | POA: Diagnosis not present

## 2018-03-26 DIAGNOSIS — F0151 Vascular dementia with behavioral disturbance: Secondary | ICD-10-CM | POA: Diagnosis not present

## 2018-03-26 DIAGNOSIS — E1159 Type 2 diabetes mellitus with other circulatory complications: Secondary | ICD-10-CM | POA: Diagnosis not present

## 2018-03-26 DIAGNOSIS — I25810 Atherosclerosis of coronary artery bypass graft(s) without angina pectoris: Secondary | ICD-10-CM | POA: Diagnosis not present

## 2018-03-26 DIAGNOSIS — I679 Cerebrovascular disease, unspecified: Secondary | ICD-10-CM | POA: Diagnosis not present

## 2018-03-26 DIAGNOSIS — I672 Cerebral atherosclerosis: Secondary | ICD-10-CM | POA: Diagnosis not present

## 2018-03-31 DIAGNOSIS — E1159 Type 2 diabetes mellitus with other circulatory complications: Secondary | ICD-10-CM | POA: Diagnosis not present

## 2018-03-31 DIAGNOSIS — I679 Cerebrovascular disease, unspecified: Secondary | ICD-10-CM | POA: Diagnosis not present

## 2018-03-31 DIAGNOSIS — N184 Chronic kidney disease, stage 4 (severe): Secondary | ICD-10-CM | POA: Diagnosis not present

## 2018-03-31 DIAGNOSIS — F0151 Vascular dementia with behavioral disturbance: Secondary | ICD-10-CM | POA: Diagnosis not present

## 2018-03-31 DIAGNOSIS — I25810 Atherosclerosis of coronary artery bypass graft(s) without angina pectoris: Secondary | ICD-10-CM | POA: Diagnosis not present

## 2018-03-31 DIAGNOSIS — I672 Cerebral atherosclerosis: Secondary | ICD-10-CM | POA: Diagnosis not present

## 2018-04-02 DIAGNOSIS — N184 Chronic kidney disease, stage 4 (severe): Secondary | ICD-10-CM | POA: Diagnosis not present

## 2018-04-02 DIAGNOSIS — I672 Cerebral atherosclerosis: Secondary | ICD-10-CM | POA: Diagnosis not present

## 2018-04-02 DIAGNOSIS — F0151 Vascular dementia with behavioral disturbance: Secondary | ICD-10-CM | POA: Diagnosis not present

## 2018-04-02 DIAGNOSIS — E1159 Type 2 diabetes mellitus with other circulatory complications: Secondary | ICD-10-CM | POA: Diagnosis not present

## 2018-04-02 DIAGNOSIS — I679 Cerebrovascular disease, unspecified: Secondary | ICD-10-CM | POA: Diagnosis not present

## 2018-04-02 DIAGNOSIS — I25810 Atherosclerosis of coronary artery bypass graft(s) without angina pectoris: Secondary | ICD-10-CM | POA: Diagnosis not present

## 2018-04-07 DIAGNOSIS — I672 Cerebral atherosclerosis: Secondary | ICD-10-CM | POA: Diagnosis not present

## 2018-04-07 DIAGNOSIS — F0151 Vascular dementia with behavioral disturbance: Secondary | ICD-10-CM | POA: Diagnosis not present

## 2018-04-07 DIAGNOSIS — I25810 Atherosclerosis of coronary artery bypass graft(s) without angina pectoris: Secondary | ICD-10-CM | POA: Diagnosis not present

## 2018-04-07 DIAGNOSIS — E1159 Type 2 diabetes mellitus with other circulatory complications: Secondary | ICD-10-CM | POA: Diagnosis not present

## 2018-04-07 DIAGNOSIS — N184 Chronic kidney disease, stage 4 (severe): Secondary | ICD-10-CM | POA: Diagnosis not present

## 2018-04-07 DIAGNOSIS — I679 Cerebrovascular disease, unspecified: Secondary | ICD-10-CM | POA: Diagnosis not present

## 2018-04-09 DIAGNOSIS — F0151 Vascular dementia with behavioral disturbance: Secondary | ICD-10-CM | POA: Diagnosis not present

## 2018-04-09 DIAGNOSIS — I672 Cerebral atherosclerosis: Secondary | ICD-10-CM | POA: Diagnosis not present

## 2018-04-09 DIAGNOSIS — E1159 Type 2 diabetes mellitus with other circulatory complications: Secondary | ICD-10-CM | POA: Diagnosis not present

## 2018-04-09 DIAGNOSIS — N184 Chronic kidney disease, stage 4 (severe): Secondary | ICD-10-CM | POA: Diagnosis not present

## 2018-04-09 DIAGNOSIS — I679 Cerebrovascular disease, unspecified: Secondary | ICD-10-CM | POA: Diagnosis not present

## 2018-04-09 DIAGNOSIS — I25810 Atherosclerosis of coronary artery bypass graft(s) without angina pectoris: Secondary | ICD-10-CM | POA: Diagnosis not present

## 2018-04-10 DIAGNOSIS — N184 Chronic kidney disease, stage 4 (severe): Secondary | ICD-10-CM | POA: Diagnosis not present

## 2018-04-10 DIAGNOSIS — E1159 Type 2 diabetes mellitus with other circulatory complications: Secondary | ICD-10-CM | POA: Diagnosis not present

## 2018-04-10 DIAGNOSIS — I25810 Atherosclerosis of coronary artery bypass graft(s) without angina pectoris: Secondary | ICD-10-CM | POA: Diagnosis not present

## 2018-04-10 DIAGNOSIS — I679 Cerebrovascular disease, unspecified: Secondary | ICD-10-CM | POA: Diagnosis not present

## 2018-04-10 DIAGNOSIS — I672 Cerebral atherosclerosis: Secondary | ICD-10-CM | POA: Diagnosis not present

## 2018-04-10 DIAGNOSIS — F0151 Vascular dementia with behavioral disturbance: Secondary | ICD-10-CM | POA: Diagnosis not present

## 2018-04-13 DIAGNOSIS — R54 Age-related physical debility: Secondary | ICD-10-CM | POA: Diagnosis not present

## 2018-04-13 DIAGNOSIS — I672 Cerebral atherosclerosis: Secondary | ICD-10-CM | POA: Diagnosis not present

## 2018-04-13 DIAGNOSIS — N184 Chronic kidney disease, stage 4 (severe): Secondary | ICD-10-CM | POA: Diagnosis not present

## 2018-04-13 DIAGNOSIS — E1159 Type 2 diabetes mellitus with other circulatory complications: Secondary | ICD-10-CM | POA: Diagnosis not present

## 2018-04-13 DIAGNOSIS — E785 Hyperlipidemia, unspecified: Secondary | ICD-10-CM | POA: Diagnosis not present

## 2018-04-13 DIAGNOSIS — R4701 Aphasia: Secondary | ICD-10-CM | POA: Diagnosis not present

## 2018-04-13 DIAGNOSIS — Z8546 Personal history of malignant neoplasm of prostate: Secondary | ICD-10-CM | POA: Diagnosis not present

## 2018-04-13 DIAGNOSIS — I10 Essential (primary) hypertension: Secondary | ICD-10-CM | POA: Diagnosis not present

## 2018-04-13 DIAGNOSIS — I25810 Atherosclerosis of coronary artery bypass graft(s) without angina pectoris: Secondary | ICD-10-CM | POA: Diagnosis not present

## 2018-04-13 DIAGNOSIS — M109 Gout, unspecified: Secondary | ICD-10-CM | POA: Diagnosis not present

## 2018-04-13 DIAGNOSIS — I679 Cerebrovascular disease, unspecified: Secondary | ICD-10-CM | POA: Diagnosis not present

## 2018-04-13 DIAGNOSIS — F0151 Vascular dementia with behavioral disturbance: Secondary | ICD-10-CM | POA: Diagnosis not present

## 2018-04-13 DIAGNOSIS — E039 Hypothyroidism, unspecified: Secondary | ICD-10-CM | POA: Diagnosis not present

## 2018-04-13 DIAGNOSIS — J309 Allergic rhinitis, unspecified: Secondary | ICD-10-CM | POA: Diagnosis not present

## 2018-04-13 DIAGNOSIS — R1310 Dysphagia, unspecified: Secondary | ICD-10-CM | POA: Diagnosis not present

## 2018-04-14 DIAGNOSIS — I679 Cerebrovascular disease, unspecified: Secondary | ICD-10-CM | POA: Diagnosis not present

## 2018-04-14 DIAGNOSIS — N184 Chronic kidney disease, stage 4 (severe): Secondary | ICD-10-CM | POA: Diagnosis not present

## 2018-04-14 DIAGNOSIS — I672 Cerebral atherosclerosis: Secondary | ICD-10-CM | POA: Diagnosis not present

## 2018-04-14 DIAGNOSIS — E1159 Type 2 diabetes mellitus with other circulatory complications: Secondary | ICD-10-CM | POA: Diagnosis not present

## 2018-04-14 DIAGNOSIS — F0151 Vascular dementia with behavioral disturbance: Secondary | ICD-10-CM | POA: Diagnosis not present

## 2018-04-14 DIAGNOSIS — I25810 Atherosclerosis of coronary artery bypass graft(s) without angina pectoris: Secondary | ICD-10-CM | POA: Diagnosis not present

## 2018-04-18 DIAGNOSIS — F0151 Vascular dementia with behavioral disturbance: Secondary | ICD-10-CM | POA: Diagnosis not present

## 2018-04-18 DIAGNOSIS — I679 Cerebrovascular disease, unspecified: Secondary | ICD-10-CM | POA: Diagnosis not present

## 2018-04-18 DIAGNOSIS — N184 Chronic kidney disease, stage 4 (severe): Secondary | ICD-10-CM | POA: Diagnosis not present

## 2018-04-18 DIAGNOSIS — E1159 Type 2 diabetes mellitus with other circulatory complications: Secondary | ICD-10-CM | POA: Diagnosis not present

## 2018-04-18 DIAGNOSIS — I672 Cerebral atherosclerosis: Secondary | ICD-10-CM | POA: Diagnosis not present

## 2018-04-18 DIAGNOSIS — I25810 Atherosclerosis of coronary artery bypass graft(s) without angina pectoris: Secondary | ICD-10-CM | POA: Diagnosis not present

## 2018-04-21 DIAGNOSIS — I672 Cerebral atherosclerosis: Secondary | ICD-10-CM | POA: Diagnosis not present

## 2018-04-21 DIAGNOSIS — I25810 Atherosclerosis of coronary artery bypass graft(s) without angina pectoris: Secondary | ICD-10-CM | POA: Diagnosis not present

## 2018-04-21 DIAGNOSIS — N184 Chronic kidney disease, stage 4 (severe): Secondary | ICD-10-CM | POA: Diagnosis not present

## 2018-04-21 DIAGNOSIS — F0151 Vascular dementia with behavioral disturbance: Secondary | ICD-10-CM | POA: Diagnosis not present

## 2018-04-21 DIAGNOSIS — I679 Cerebrovascular disease, unspecified: Secondary | ICD-10-CM | POA: Diagnosis not present

## 2018-04-21 DIAGNOSIS — E1159 Type 2 diabetes mellitus with other circulatory complications: Secondary | ICD-10-CM | POA: Diagnosis not present

## 2018-04-23 DIAGNOSIS — E1159 Type 2 diabetes mellitus with other circulatory complications: Secondary | ICD-10-CM | POA: Diagnosis not present

## 2018-04-23 DIAGNOSIS — I672 Cerebral atherosclerosis: Secondary | ICD-10-CM | POA: Diagnosis not present

## 2018-04-23 DIAGNOSIS — I25810 Atherosclerosis of coronary artery bypass graft(s) without angina pectoris: Secondary | ICD-10-CM | POA: Diagnosis not present

## 2018-04-23 DIAGNOSIS — F0151 Vascular dementia with behavioral disturbance: Secondary | ICD-10-CM | POA: Diagnosis not present

## 2018-04-23 DIAGNOSIS — I679 Cerebrovascular disease, unspecified: Secondary | ICD-10-CM | POA: Diagnosis not present

## 2018-04-23 DIAGNOSIS — N184 Chronic kidney disease, stage 4 (severe): Secondary | ICD-10-CM | POA: Diagnosis not present

## 2018-04-27 DIAGNOSIS — I672 Cerebral atherosclerosis: Secondary | ICD-10-CM | POA: Diagnosis not present

## 2018-04-27 DIAGNOSIS — E1159 Type 2 diabetes mellitus with other circulatory complications: Secondary | ICD-10-CM | POA: Diagnosis not present

## 2018-04-27 DIAGNOSIS — I679 Cerebrovascular disease, unspecified: Secondary | ICD-10-CM | POA: Diagnosis not present

## 2018-04-27 DIAGNOSIS — I25810 Atherosclerosis of coronary artery bypass graft(s) without angina pectoris: Secondary | ICD-10-CM | POA: Diagnosis not present

## 2018-04-27 DIAGNOSIS — F0151 Vascular dementia with behavioral disturbance: Secondary | ICD-10-CM | POA: Diagnosis not present

## 2018-04-27 DIAGNOSIS — N184 Chronic kidney disease, stage 4 (severe): Secondary | ICD-10-CM | POA: Diagnosis not present

## 2018-04-28 DIAGNOSIS — F0151 Vascular dementia with behavioral disturbance: Secondary | ICD-10-CM | POA: Diagnosis not present

## 2018-04-28 DIAGNOSIS — E1159 Type 2 diabetes mellitus with other circulatory complications: Secondary | ICD-10-CM | POA: Diagnosis not present

## 2018-04-28 DIAGNOSIS — N184 Chronic kidney disease, stage 4 (severe): Secondary | ICD-10-CM | POA: Diagnosis not present

## 2018-04-28 DIAGNOSIS — I679 Cerebrovascular disease, unspecified: Secondary | ICD-10-CM | POA: Diagnosis not present

## 2018-04-28 DIAGNOSIS — I672 Cerebral atherosclerosis: Secondary | ICD-10-CM | POA: Diagnosis not present

## 2018-04-28 DIAGNOSIS — I25810 Atherosclerosis of coronary artery bypass graft(s) without angina pectoris: Secondary | ICD-10-CM | POA: Diagnosis not present

## 2018-04-30 DIAGNOSIS — N184 Chronic kidney disease, stage 4 (severe): Secondary | ICD-10-CM | POA: Diagnosis not present

## 2018-04-30 DIAGNOSIS — I25810 Atherosclerosis of coronary artery bypass graft(s) without angina pectoris: Secondary | ICD-10-CM | POA: Diagnosis not present

## 2018-04-30 DIAGNOSIS — I679 Cerebrovascular disease, unspecified: Secondary | ICD-10-CM | POA: Diagnosis not present

## 2018-04-30 DIAGNOSIS — E1159 Type 2 diabetes mellitus with other circulatory complications: Secondary | ICD-10-CM | POA: Diagnosis not present

## 2018-04-30 DIAGNOSIS — F0151 Vascular dementia with behavioral disturbance: Secondary | ICD-10-CM | POA: Diagnosis not present

## 2018-04-30 DIAGNOSIS — I672 Cerebral atherosclerosis: Secondary | ICD-10-CM | POA: Diagnosis not present

## 2018-05-05 DIAGNOSIS — I672 Cerebral atherosclerosis: Secondary | ICD-10-CM | POA: Diagnosis not present

## 2018-05-05 DIAGNOSIS — I25810 Atherosclerosis of coronary artery bypass graft(s) without angina pectoris: Secondary | ICD-10-CM | POA: Diagnosis not present

## 2018-05-05 DIAGNOSIS — F0151 Vascular dementia with behavioral disturbance: Secondary | ICD-10-CM | POA: Diagnosis not present

## 2018-05-05 DIAGNOSIS — N184 Chronic kidney disease, stage 4 (severe): Secondary | ICD-10-CM | POA: Diagnosis not present

## 2018-05-05 DIAGNOSIS — I679 Cerebrovascular disease, unspecified: Secondary | ICD-10-CM | POA: Diagnosis not present

## 2018-05-05 DIAGNOSIS — E1159 Type 2 diabetes mellitus with other circulatory complications: Secondary | ICD-10-CM | POA: Diagnosis not present

## 2018-05-07 DIAGNOSIS — E1159 Type 2 diabetes mellitus with other circulatory complications: Secondary | ICD-10-CM | POA: Diagnosis not present

## 2018-05-07 DIAGNOSIS — I25810 Atherosclerosis of coronary artery bypass graft(s) without angina pectoris: Secondary | ICD-10-CM | POA: Diagnosis not present

## 2018-05-07 DIAGNOSIS — I672 Cerebral atherosclerosis: Secondary | ICD-10-CM | POA: Diagnosis not present

## 2018-05-07 DIAGNOSIS — I679 Cerebrovascular disease, unspecified: Secondary | ICD-10-CM | POA: Diagnosis not present

## 2018-05-07 DIAGNOSIS — F0151 Vascular dementia with behavioral disturbance: Secondary | ICD-10-CM | POA: Diagnosis not present

## 2018-05-07 DIAGNOSIS — N184 Chronic kidney disease, stage 4 (severe): Secondary | ICD-10-CM | POA: Diagnosis not present

## 2018-05-12 DIAGNOSIS — E1159 Type 2 diabetes mellitus with other circulatory complications: Secondary | ICD-10-CM | POA: Diagnosis not present

## 2018-05-12 DIAGNOSIS — I672 Cerebral atherosclerosis: Secondary | ICD-10-CM | POA: Diagnosis not present

## 2018-05-12 DIAGNOSIS — I25810 Atherosclerosis of coronary artery bypass graft(s) without angina pectoris: Secondary | ICD-10-CM | POA: Diagnosis not present

## 2018-05-12 DIAGNOSIS — N184 Chronic kidney disease, stage 4 (severe): Secondary | ICD-10-CM | POA: Diagnosis not present

## 2018-05-12 DIAGNOSIS — I679 Cerebrovascular disease, unspecified: Secondary | ICD-10-CM | POA: Diagnosis not present

## 2018-05-12 DIAGNOSIS — F0151 Vascular dementia with behavioral disturbance: Secondary | ICD-10-CM | POA: Diagnosis not present

## 2018-05-13 DIAGNOSIS — F0151 Vascular dementia with behavioral disturbance: Secondary | ICD-10-CM | POA: Diagnosis not present

## 2018-05-13 DIAGNOSIS — I25810 Atherosclerosis of coronary artery bypass graft(s) without angina pectoris: Secondary | ICD-10-CM | POA: Diagnosis not present

## 2018-05-13 DIAGNOSIS — N184 Chronic kidney disease, stage 4 (severe): Secondary | ICD-10-CM | POA: Diagnosis not present

## 2018-05-13 DIAGNOSIS — I679 Cerebrovascular disease, unspecified: Secondary | ICD-10-CM | POA: Diagnosis not present

## 2018-05-13 DIAGNOSIS — I672 Cerebral atherosclerosis: Secondary | ICD-10-CM | POA: Diagnosis not present

## 2018-05-13 DIAGNOSIS — E1159 Type 2 diabetes mellitus with other circulatory complications: Secondary | ICD-10-CM | POA: Diagnosis not present

## 2018-05-14 DIAGNOSIS — J309 Allergic rhinitis, unspecified: Secondary | ICD-10-CM | POA: Diagnosis not present

## 2018-05-14 DIAGNOSIS — R54 Age-related physical debility: Secondary | ICD-10-CM | POA: Diagnosis not present

## 2018-05-14 DIAGNOSIS — E785 Hyperlipidemia, unspecified: Secondary | ICD-10-CM | POA: Diagnosis not present

## 2018-05-14 DIAGNOSIS — I25810 Atherosclerosis of coronary artery bypass graft(s) without angina pectoris: Secondary | ICD-10-CM | POA: Diagnosis not present

## 2018-05-14 DIAGNOSIS — M109 Gout, unspecified: Secondary | ICD-10-CM | POA: Diagnosis not present

## 2018-05-14 DIAGNOSIS — R4701 Aphasia: Secondary | ICD-10-CM | POA: Diagnosis not present

## 2018-05-14 DIAGNOSIS — I672 Cerebral atherosclerosis: Secondary | ICD-10-CM | POA: Diagnosis not present

## 2018-05-14 DIAGNOSIS — I10 Essential (primary) hypertension: Secondary | ICD-10-CM | POA: Diagnosis not present

## 2018-05-14 DIAGNOSIS — N184 Chronic kidney disease, stage 4 (severe): Secondary | ICD-10-CM | POA: Diagnosis not present

## 2018-05-14 DIAGNOSIS — F0151 Vascular dementia with behavioral disturbance: Secondary | ICD-10-CM | POA: Diagnosis not present

## 2018-05-14 DIAGNOSIS — I679 Cerebrovascular disease, unspecified: Secondary | ICD-10-CM | POA: Diagnosis not present

## 2018-05-14 DIAGNOSIS — Z8546 Personal history of malignant neoplasm of prostate: Secondary | ICD-10-CM | POA: Diagnosis not present

## 2018-05-14 DIAGNOSIS — E1159 Type 2 diabetes mellitus with other circulatory complications: Secondary | ICD-10-CM | POA: Diagnosis not present

## 2018-05-14 DIAGNOSIS — E039 Hypothyroidism, unspecified: Secondary | ICD-10-CM | POA: Diagnosis not present

## 2018-05-14 DIAGNOSIS — R1310 Dysphagia, unspecified: Secondary | ICD-10-CM | POA: Diagnosis not present

## 2018-05-15 DIAGNOSIS — F0151 Vascular dementia with behavioral disturbance: Secondary | ICD-10-CM | POA: Diagnosis not present

## 2018-05-15 DIAGNOSIS — N184 Chronic kidney disease, stage 4 (severe): Secondary | ICD-10-CM | POA: Diagnosis not present

## 2018-05-15 DIAGNOSIS — I672 Cerebral atherosclerosis: Secondary | ICD-10-CM | POA: Diagnosis not present

## 2018-05-15 DIAGNOSIS — E1159 Type 2 diabetes mellitus with other circulatory complications: Secondary | ICD-10-CM | POA: Diagnosis not present

## 2018-05-15 DIAGNOSIS — I679 Cerebrovascular disease, unspecified: Secondary | ICD-10-CM | POA: Diagnosis not present

## 2018-05-15 DIAGNOSIS — I25810 Atherosclerosis of coronary artery bypass graft(s) without angina pectoris: Secondary | ICD-10-CM | POA: Diagnosis not present

## 2018-05-19 DIAGNOSIS — I672 Cerebral atherosclerosis: Secondary | ICD-10-CM | POA: Diagnosis not present

## 2018-05-19 DIAGNOSIS — F0151 Vascular dementia with behavioral disturbance: Secondary | ICD-10-CM | POA: Diagnosis not present

## 2018-05-19 DIAGNOSIS — E1159 Type 2 diabetes mellitus with other circulatory complications: Secondary | ICD-10-CM | POA: Diagnosis not present

## 2018-05-19 DIAGNOSIS — I25810 Atherosclerosis of coronary artery bypass graft(s) without angina pectoris: Secondary | ICD-10-CM | POA: Diagnosis not present

## 2018-05-19 DIAGNOSIS — N184 Chronic kidney disease, stage 4 (severe): Secondary | ICD-10-CM | POA: Diagnosis not present

## 2018-05-19 DIAGNOSIS — I679 Cerebrovascular disease, unspecified: Secondary | ICD-10-CM | POA: Diagnosis not present

## 2018-05-21 DIAGNOSIS — I25810 Atherosclerosis of coronary artery bypass graft(s) without angina pectoris: Secondary | ICD-10-CM | POA: Diagnosis not present

## 2018-05-21 DIAGNOSIS — N184 Chronic kidney disease, stage 4 (severe): Secondary | ICD-10-CM | POA: Diagnosis not present

## 2018-05-21 DIAGNOSIS — I679 Cerebrovascular disease, unspecified: Secondary | ICD-10-CM | POA: Diagnosis not present

## 2018-05-21 DIAGNOSIS — F0151 Vascular dementia with behavioral disturbance: Secondary | ICD-10-CM | POA: Diagnosis not present

## 2018-05-21 DIAGNOSIS — I672 Cerebral atherosclerosis: Secondary | ICD-10-CM | POA: Diagnosis not present

## 2018-05-21 DIAGNOSIS — E1159 Type 2 diabetes mellitus with other circulatory complications: Secondary | ICD-10-CM | POA: Diagnosis not present

## 2018-05-26 DIAGNOSIS — E1159 Type 2 diabetes mellitus with other circulatory complications: Secondary | ICD-10-CM | POA: Diagnosis not present

## 2018-05-26 DIAGNOSIS — N184 Chronic kidney disease, stage 4 (severe): Secondary | ICD-10-CM | POA: Diagnosis not present

## 2018-05-26 DIAGNOSIS — I679 Cerebrovascular disease, unspecified: Secondary | ICD-10-CM | POA: Diagnosis not present

## 2018-05-26 DIAGNOSIS — I672 Cerebral atherosclerosis: Secondary | ICD-10-CM | POA: Diagnosis not present

## 2018-05-26 DIAGNOSIS — I25810 Atherosclerosis of coronary artery bypass graft(s) without angina pectoris: Secondary | ICD-10-CM | POA: Diagnosis not present

## 2018-05-26 DIAGNOSIS — F0151 Vascular dementia with behavioral disturbance: Secondary | ICD-10-CM | POA: Diagnosis not present

## 2018-05-28 DIAGNOSIS — N184 Chronic kidney disease, stage 4 (severe): Secondary | ICD-10-CM | POA: Diagnosis not present

## 2018-05-28 DIAGNOSIS — I25810 Atherosclerosis of coronary artery bypass graft(s) without angina pectoris: Secondary | ICD-10-CM | POA: Diagnosis not present

## 2018-05-28 DIAGNOSIS — I672 Cerebral atherosclerosis: Secondary | ICD-10-CM | POA: Diagnosis not present

## 2018-05-28 DIAGNOSIS — I679 Cerebrovascular disease, unspecified: Secondary | ICD-10-CM | POA: Diagnosis not present

## 2018-05-28 DIAGNOSIS — E1159 Type 2 diabetes mellitus with other circulatory complications: Secondary | ICD-10-CM | POA: Diagnosis not present

## 2018-05-28 DIAGNOSIS — F0151 Vascular dementia with behavioral disturbance: Secondary | ICD-10-CM | POA: Diagnosis not present

## 2018-06-02 DIAGNOSIS — N184 Chronic kidney disease, stage 4 (severe): Secondary | ICD-10-CM | POA: Diagnosis not present

## 2018-06-02 DIAGNOSIS — I25810 Atherosclerosis of coronary artery bypass graft(s) without angina pectoris: Secondary | ICD-10-CM | POA: Diagnosis not present

## 2018-06-02 DIAGNOSIS — F0151 Vascular dementia with behavioral disturbance: Secondary | ICD-10-CM | POA: Diagnosis not present

## 2018-06-02 DIAGNOSIS — I679 Cerebrovascular disease, unspecified: Secondary | ICD-10-CM | POA: Diagnosis not present

## 2018-06-02 DIAGNOSIS — E1159 Type 2 diabetes mellitus with other circulatory complications: Secondary | ICD-10-CM | POA: Diagnosis not present

## 2018-06-02 DIAGNOSIS — I672 Cerebral atherosclerosis: Secondary | ICD-10-CM | POA: Diagnosis not present

## 2018-06-04 DIAGNOSIS — I679 Cerebrovascular disease, unspecified: Secondary | ICD-10-CM | POA: Diagnosis not present

## 2018-06-04 DIAGNOSIS — I25810 Atherosclerosis of coronary artery bypass graft(s) without angina pectoris: Secondary | ICD-10-CM | POA: Diagnosis not present

## 2018-06-04 DIAGNOSIS — I672 Cerebral atherosclerosis: Secondary | ICD-10-CM | POA: Diagnosis not present

## 2018-06-04 DIAGNOSIS — F0151 Vascular dementia with behavioral disturbance: Secondary | ICD-10-CM | POA: Diagnosis not present

## 2018-06-04 DIAGNOSIS — N184 Chronic kidney disease, stage 4 (severe): Secondary | ICD-10-CM | POA: Diagnosis not present

## 2018-06-04 DIAGNOSIS — E1159 Type 2 diabetes mellitus with other circulatory complications: Secondary | ICD-10-CM | POA: Diagnosis not present

## 2018-06-09 DIAGNOSIS — I672 Cerebral atherosclerosis: Secondary | ICD-10-CM | POA: Diagnosis not present

## 2018-06-09 DIAGNOSIS — I25810 Atherosclerosis of coronary artery bypass graft(s) without angina pectoris: Secondary | ICD-10-CM | POA: Diagnosis not present

## 2018-06-09 DIAGNOSIS — N184 Chronic kidney disease, stage 4 (severe): Secondary | ICD-10-CM | POA: Diagnosis not present

## 2018-06-09 DIAGNOSIS — E1159 Type 2 diabetes mellitus with other circulatory complications: Secondary | ICD-10-CM | POA: Diagnosis not present

## 2018-06-09 DIAGNOSIS — I679 Cerebrovascular disease, unspecified: Secondary | ICD-10-CM | POA: Diagnosis not present

## 2018-06-09 DIAGNOSIS — F0151 Vascular dementia with behavioral disturbance: Secondary | ICD-10-CM | POA: Diagnosis not present

## 2018-06-11 DIAGNOSIS — F0151 Vascular dementia with behavioral disturbance: Secondary | ICD-10-CM | POA: Diagnosis not present

## 2018-06-11 DIAGNOSIS — I672 Cerebral atherosclerosis: Secondary | ICD-10-CM | POA: Diagnosis not present

## 2018-06-11 DIAGNOSIS — I25810 Atherosclerosis of coronary artery bypass graft(s) without angina pectoris: Secondary | ICD-10-CM | POA: Diagnosis not present

## 2018-06-11 DIAGNOSIS — I679 Cerebrovascular disease, unspecified: Secondary | ICD-10-CM | POA: Diagnosis not present

## 2018-06-11 DIAGNOSIS — E1159 Type 2 diabetes mellitus with other circulatory complications: Secondary | ICD-10-CM | POA: Diagnosis not present

## 2018-06-11 DIAGNOSIS — N184 Chronic kidney disease, stage 4 (severe): Secondary | ICD-10-CM | POA: Diagnosis not present

## 2018-06-14 DIAGNOSIS — E039 Hypothyroidism, unspecified: Secondary | ICD-10-CM | POA: Diagnosis not present

## 2018-06-14 DIAGNOSIS — F0151 Vascular dementia with behavioral disturbance: Secondary | ICD-10-CM | POA: Diagnosis not present

## 2018-06-14 DIAGNOSIS — R1310 Dysphagia, unspecified: Secondary | ICD-10-CM | POA: Diagnosis not present

## 2018-06-14 DIAGNOSIS — M109 Gout, unspecified: Secondary | ICD-10-CM | POA: Diagnosis not present

## 2018-06-14 DIAGNOSIS — R54 Age-related physical debility: Secondary | ICD-10-CM | POA: Diagnosis not present

## 2018-06-14 DIAGNOSIS — N184 Chronic kidney disease, stage 4 (severe): Secondary | ICD-10-CM | POA: Diagnosis not present

## 2018-06-14 DIAGNOSIS — R4701 Aphasia: Secondary | ICD-10-CM | POA: Diagnosis not present

## 2018-06-14 DIAGNOSIS — I679 Cerebrovascular disease, unspecified: Secondary | ICD-10-CM | POA: Diagnosis not present

## 2018-06-14 DIAGNOSIS — J309 Allergic rhinitis, unspecified: Secondary | ICD-10-CM | POA: Diagnosis not present

## 2018-06-14 DIAGNOSIS — I10 Essential (primary) hypertension: Secondary | ICD-10-CM | POA: Diagnosis not present

## 2018-06-14 DIAGNOSIS — E1159 Type 2 diabetes mellitus with other circulatory complications: Secondary | ICD-10-CM | POA: Diagnosis not present

## 2018-06-14 DIAGNOSIS — Z8546 Personal history of malignant neoplasm of prostate: Secondary | ICD-10-CM | POA: Diagnosis not present

## 2018-06-14 DIAGNOSIS — I672 Cerebral atherosclerosis: Secondary | ICD-10-CM | POA: Diagnosis not present

## 2018-06-14 DIAGNOSIS — I25810 Atherosclerosis of coronary artery bypass graft(s) without angina pectoris: Secondary | ICD-10-CM | POA: Diagnosis not present

## 2018-06-14 DIAGNOSIS — E785 Hyperlipidemia, unspecified: Secondary | ICD-10-CM | POA: Diagnosis not present

## 2018-06-16 DIAGNOSIS — I25810 Atherosclerosis of coronary artery bypass graft(s) without angina pectoris: Secondary | ICD-10-CM | POA: Diagnosis not present

## 2018-06-16 DIAGNOSIS — I672 Cerebral atherosclerosis: Secondary | ICD-10-CM | POA: Diagnosis not present

## 2018-06-16 DIAGNOSIS — F0151 Vascular dementia with behavioral disturbance: Secondary | ICD-10-CM | POA: Diagnosis not present

## 2018-06-16 DIAGNOSIS — N184 Chronic kidney disease, stage 4 (severe): Secondary | ICD-10-CM | POA: Diagnosis not present

## 2018-06-16 DIAGNOSIS — I679 Cerebrovascular disease, unspecified: Secondary | ICD-10-CM | POA: Diagnosis not present

## 2018-06-16 DIAGNOSIS — E1159 Type 2 diabetes mellitus with other circulatory complications: Secondary | ICD-10-CM | POA: Diagnosis not present

## 2018-06-18 DIAGNOSIS — N184 Chronic kidney disease, stage 4 (severe): Secondary | ICD-10-CM | POA: Diagnosis not present

## 2018-06-18 DIAGNOSIS — I672 Cerebral atherosclerosis: Secondary | ICD-10-CM | POA: Diagnosis not present

## 2018-06-18 DIAGNOSIS — E1159 Type 2 diabetes mellitus with other circulatory complications: Secondary | ICD-10-CM | POA: Diagnosis not present

## 2018-06-18 DIAGNOSIS — I25810 Atherosclerosis of coronary artery bypass graft(s) without angina pectoris: Secondary | ICD-10-CM | POA: Diagnosis not present

## 2018-06-18 DIAGNOSIS — F0151 Vascular dementia with behavioral disturbance: Secondary | ICD-10-CM | POA: Diagnosis not present

## 2018-06-18 DIAGNOSIS — I679 Cerebrovascular disease, unspecified: Secondary | ICD-10-CM | POA: Diagnosis not present

## 2018-06-23 DIAGNOSIS — F0151 Vascular dementia with behavioral disturbance: Secondary | ICD-10-CM | POA: Diagnosis not present

## 2018-06-23 DIAGNOSIS — I672 Cerebral atherosclerosis: Secondary | ICD-10-CM | POA: Diagnosis not present

## 2018-06-23 DIAGNOSIS — N184 Chronic kidney disease, stage 4 (severe): Secondary | ICD-10-CM | POA: Diagnosis not present

## 2018-06-23 DIAGNOSIS — I25810 Atherosclerosis of coronary artery bypass graft(s) without angina pectoris: Secondary | ICD-10-CM | POA: Diagnosis not present

## 2018-06-23 DIAGNOSIS — I679 Cerebrovascular disease, unspecified: Secondary | ICD-10-CM | POA: Diagnosis not present

## 2018-06-23 DIAGNOSIS — E1159 Type 2 diabetes mellitus with other circulatory complications: Secondary | ICD-10-CM | POA: Diagnosis not present

## 2018-06-25 DIAGNOSIS — I672 Cerebral atherosclerosis: Secondary | ICD-10-CM | POA: Diagnosis not present

## 2018-06-25 DIAGNOSIS — F0151 Vascular dementia with behavioral disturbance: Secondary | ICD-10-CM | POA: Diagnosis not present

## 2018-06-25 DIAGNOSIS — E1159 Type 2 diabetes mellitus with other circulatory complications: Secondary | ICD-10-CM | POA: Diagnosis not present

## 2018-06-25 DIAGNOSIS — N184 Chronic kidney disease, stage 4 (severe): Secondary | ICD-10-CM | POA: Diagnosis not present

## 2018-06-25 DIAGNOSIS — I679 Cerebrovascular disease, unspecified: Secondary | ICD-10-CM | POA: Diagnosis not present

## 2018-06-25 DIAGNOSIS — I25810 Atherosclerosis of coronary artery bypass graft(s) without angina pectoris: Secondary | ICD-10-CM | POA: Diagnosis not present

## 2018-06-30 DIAGNOSIS — I25810 Atherosclerosis of coronary artery bypass graft(s) without angina pectoris: Secondary | ICD-10-CM | POA: Diagnosis not present

## 2018-06-30 DIAGNOSIS — E1159 Type 2 diabetes mellitus with other circulatory complications: Secondary | ICD-10-CM | POA: Diagnosis not present

## 2018-06-30 DIAGNOSIS — I672 Cerebral atherosclerosis: Secondary | ICD-10-CM | POA: Diagnosis not present

## 2018-06-30 DIAGNOSIS — I679 Cerebrovascular disease, unspecified: Secondary | ICD-10-CM | POA: Diagnosis not present

## 2018-06-30 DIAGNOSIS — N184 Chronic kidney disease, stage 4 (severe): Secondary | ICD-10-CM | POA: Diagnosis not present

## 2018-06-30 DIAGNOSIS — F0151 Vascular dementia with behavioral disturbance: Secondary | ICD-10-CM | POA: Diagnosis not present

## 2018-07-02 DIAGNOSIS — I672 Cerebral atherosclerosis: Secondary | ICD-10-CM | POA: Diagnosis not present

## 2018-07-02 DIAGNOSIS — F0151 Vascular dementia with behavioral disturbance: Secondary | ICD-10-CM | POA: Diagnosis not present

## 2018-07-02 DIAGNOSIS — N184 Chronic kidney disease, stage 4 (severe): Secondary | ICD-10-CM | POA: Diagnosis not present

## 2018-07-02 DIAGNOSIS — I679 Cerebrovascular disease, unspecified: Secondary | ICD-10-CM | POA: Diagnosis not present

## 2018-07-02 DIAGNOSIS — I25810 Atherosclerosis of coronary artery bypass graft(s) without angina pectoris: Secondary | ICD-10-CM | POA: Diagnosis not present

## 2018-07-02 DIAGNOSIS — E1159 Type 2 diabetes mellitus with other circulatory complications: Secondary | ICD-10-CM | POA: Diagnosis not present

## 2018-07-03 DIAGNOSIS — I679 Cerebrovascular disease, unspecified: Secondary | ICD-10-CM | POA: Diagnosis not present

## 2018-07-03 DIAGNOSIS — E1159 Type 2 diabetes mellitus with other circulatory complications: Secondary | ICD-10-CM | POA: Diagnosis not present

## 2018-07-03 DIAGNOSIS — I25810 Atherosclerosis of coronary artery bypass graft(s) without angina pectoris: Secondary | ICD-10-CM | POA: Diagnosis not present

## 2018-07-03 DIAGNOSIS — N184 Chronic kidney disease, stage 4 (severe): Secondary | ICD-10-CM | POA: Diagnosis not present

## 2018-07-03 DIAGNOSIS — I672 Cerebral atherosclerosis: Secondary | ICD-10-CM | POA: Diagnosis not present

## 2018-07-03 DIAGNOSIS — F0151 Vascular dementia with behavioral disturbance: Secondary | ICD-10-CM | POA: Diagnosis not present

## 2018-07-07 DIAGNOSIS — E1159 Type 2 diabetes mellitus with other circulatory complications: Secondary | ICD-10-CM | POA: Diagnosis not present

## 2018-07-07 DIAGNOSIS — N184 Chronic kidney disease, stage 4 (severe): Secondary | ICD-10-CM | POA: Diagnosis not present

## 2018-07-07 DIAGNOSIS — I25810 Atherosclerosis of coronary artery bypass graft(s) without angina pectoris: Secondary | ICD-10-CM | POA: Diagnosis not present

## 2018-07-07 DIAGNOSIS — I679 Cerebrovascular disease, unspecified: Secondary | ICD-10-CM | POA: Diagnosis not present

## 2018-07-07 DIAGNOSIS — I672 Cerebral atherosclerosis: Secondary | ICD-10-CM | POA: Diagnosis not present

## 2018-07-07 DIAGNOSIS — F0151 Vascular dementia with behavioral disturbance: Secondary | ICD-10-CM | POA: Diagnosis not present

## 2018-07-08 DIAGNOSIS — I679 Cerebrovascular disease, unspecified: Secondary | ICD-10-CM | POA: Diagnosis not present

## 2018-07-08 DIAGNOSIS — N184 Chronic kidney disease, stage 4 (severe): Secondary | ICD-10-CM | POA: Diagnosis not present

## 2018-07-08 DIAGNOSIS — I672 Cerebral atherosclerosis: Secondary | ICD-10-CM | POA: Diagnosis not present

## 2018-07-08 DIAGNOSIS — F0151 Vascular dementia with behavioral disturbance: Secondary | ICD-10-CM | POA: Diagnosis not present

## 2018-07-08 DIAGNOSIS — E1159 Type 2 diabetes mellitus with other circulatory complications: Secondary | ICD-10-CM | POA: Diagnosis not present

## 2018-07-08 DIAGNOSIS — I25810 Atherosclerosis of coronary artery bypass graft(s) without angina pectoris: Secondary | ICD-10-CM | POA: Diagnosis not present

## 2018-07-09 DIAGNOSIS — I25810 Atherosclerosis of coronary artery bypass graft(s) without angina pectoris: Secondary | ICD-10-CM | POA: Diagnosis not present

## 2018-07-09 DIAGNOSIS — E1159 Type 2 diabetes mellitus with other circulatory complications: Secondary | ICD-10-CM | POA: Diagnosis not present

## 2018-07-09 DIAGNOSIS — I672 Cerebral atherosclerosis: Secondary | ICD-10-CM | POA: Diagnosis not present

## 2018-07-09 DIAGNOSIS — F0151 Vascular dementia with behavioral disturbance: Secondary | ICD-10-CM | POA: Diagnosis not present

## 2018-07-09 DIAGNOSIS — I679 Cerebrovascular disease, unspecified: Secondary | ICD-10-CM | POA: Diagnosis not present

## 2018-07-09 DIAGNOSIS — N184 Chronic kidney disease, stage 4 (severe): Secondary | ICD-10-CM | POA: Diagnosis not present

## 2018-07-14 DIAGNOSIS — E039 Hypothyroidism, unspecified: Secondary | ICD-10-CM | POA: Diagnosis not present

## 2018-07-14 DIAGNOSIS — I25810 Atherosclerosis of coronary artery bypass graft(s) without angina pectoris: Secondary | ICD-10-CM | POA: Diagnosis not present

## 2018-07-14 DIAGNOSIS — R54 Age-related physical debility: Secondary | ICD-10-CM | POA: Diagnosis not present

## 2018-07-14 DIAGNOSIS — E1159 Type 2 diabetes mellitus with other circulatory complications: Secondary | ICD-10-CM | POA: Diagnosis not present

## 2018-07-14 DIAGNOSIS — I672 Cerebral atherosclerosis: Secondary | ICD-10-CM | POA: Diagnosis not present

## 2018-07-14 DIAGNOSIS — F0151 Vascular dementia with behavioral disturbance: Secondary | ICD-10-CM | POA: Diagnosis not present

## 2018-07-14 DIAGNOSIS — Z8546 Personal history of malignant neoplasm of prostate: Secondary | ICD-10-CM | POA: Diagnosis not present

## 2018-07-14 DIAGNOSIS — E785 Hyperlipidemia, unspecified: Secondary | ICD-10-CM | POA: Diagnosis not present

## 2018-07-14 DIAGNOSIS — I679 Cerebrovascular disease, unspecified: Secondary | ICD-10-CM | POA: Diagnosis not present

## 2018-07-14 DIAGNOSIS — J309 Allergic rhinitis, unspecified: Secondary | ICD-10-CM | POA: Diagnosis not present

## 2018-07-14 DIAGNOSIS — N184 Chronic kidney disease, stage 4 (severe): Secondary | ICD-10-CM | POA: Diagnosis not present

## 2018-07-14 DIAGNOSIS — R1310 Dysphagia, unspecified: Secondary | ICD-10-CM | POA: Diagnosis not present

## 2018-07-14 DIAGNOSIS — M109 Gout, unspecified: Secondary | ICD-10-CM | POA: Diagnosis not present

## 2018-07-14 DIAGNOSIS — I10 Essential (primary) hypertension: Secondary | ICD-10-CM | POA: Diagnosis not present

## 2018-07-14 DIAGNOSIS — R4701 Aphasia: Secondary | ICD-10-CM | POA: Diagnosis not present

## 2018-07-16 DIAGNOSIS — I25810 Atherosclerosis of coronary artery bypass graft(s) without angina pectoris: Secondary | ICD-10-CM | POA: Diagnosis not present

## 2018-07-16 DIAGNOSIS — N184 Chronic kidney disease, stage 4 (severe): Secondary | ICD-10-CM | POA: Diagnosis not present

## 2018-07-16 DIAGNOSIS — I679 Cerebrovascular disease, unspecified: Secondary | ICD-10-CM | POA: Diagnosis not present

## 2018-07-16 DIAGNOSIS — E1159 Type 2 diabetes mellitus with other circulatory complications: Secondary | ICD-10-CM | POA: Diagnosis not present

## 2018-07-16 DIAGNOSIS — F0151 Vascular dementia with behavioral disturbance: Secondary | ICD-10-CM | POA: Diagnosis not present

## 2018-07-16 DIAGNOSIS — I672 Cerebral atherosclerosis: Secondary | ICD-10-CM | POA: Diagnosis not present

## 2018-07-17 DIAGNOSIS — N184 Chronic kidney disease, stage 4 (severe): Secondary | ICD-10-CM | POA: Diagnosis not present

## 2018-07-17 DIAGNOSIS — I672 Cerebral atherosclerosis: Secondary | ICD-10-CM | POA: Diagnosis not present

## 2018-07-17 DIAGNOSIS — E1159 Type 2 diabetes mellitus with other circulatory complications: Secondary | ICD-10-CM | POA: Diagnosis not present

## 2018-07-17 DIAGNOSIS — I25810 Atherosclerosis of coronary artery bypass graft(s) without angina pectoris: Secondary | ICD-10-CM | POA: Diagnosis not present

## 2018-07-17 DIAGNOSIS — F0151 Vascular dementia with behavioral disturbance: Secondary | ICD-10-CM | POA: Diagnosis not present

## 2018-07-17 DIAGNOSIS — I679 Cerebrovascular disease, unspecified: Secondary | ICD-10-CM | POA: Diagnosis not present

## 2018-07-21 DIAGNOSIS — F0151 Vascular dementia with behavioral disturbance: Secondary | ICD-10-CM | POA: Diagnosis not present

## 2018-07-21 DIAGNOSIS — N184 Chronic kidney disease, stage 4 (severe): Secondary | ICD-10-CM | POA: Diagnosis not present

## 2018-07-21 DIAGNOSIS — I679 Cerebrovascular disease, unspecified: Secondary | ICD-10-CM | POA: Diagnosis not present

## 2018-07-21 DIAGNOSIS — I25810 Atherosclerosis of coronary artery bypass graft(s) without angina pectoris: Secondary | ICD-10-CM | POA: Diagnosis not present

## 2018-07-21 DIAGNOSIS — E1159 Type 2 diabetes mellitus with other circulatory complications: Secondary | ICD-10-CM | POA: Diagnosis not present

## 2018-07-21 DIAGNOSIS — I672 Cerebral atherosclerosis: Secondary | ICD-10-CM | POA: Diagnosis not present

## 2018-07-23 DIAGNOSIS — F0151 Vascular dementia with behavioral disturbance: Secondary | ICD-10-CM | POA: Diagnosis not present

## 2018-07-23 DIAGNOSIS — I25810 Atherosclerosis of coronary artery bypass graft(s) without angina pectoris: Secondary | ICD-10-CM | POA: Diagnosis not present

## 2018-07-23 DIAGNOSIS — E1159 Type 2 diabetes mellitus with other circulatory complications: Secondary | ICD-10-CM | POA: Diagnosis not present

## 2018-07-23 DIAGNOSIS — I672 Cerebral atherosclerosis: Secondary | ICD-10-CM | POA: Diagnosis not present

## 2018-07-23 DIAGNOSIS — I679 Cerebrovascular disease, unspecified: Secondary | ICD-10-CM | POA: Diagnosis not present

## 2018-07-23 DIAGNOSIS — N184 Chronic kidney disease, stage 4 (severe): Secondary | ICD-10-CM | POA: Diagnosis not present

## 2018-07-24 DIAGNOSIS — I672 Cerebral atherosclerosis: Secondary | ICD-10-CM | POA: Diagnosis not present

## 2018-07-24 DIAGNOSIS — I25810 Atherosclerosis of coronary artery bypass graft(s) without angina pectoris: Secondary | ICD-10-CM | POA: Diagnosis not present

## 2018-07-24 DIAGNOSIS — E1159 Type 2 diabetes mellitus with other circulatory complications: Secondary | ICD-10-CM | POA: Diagnosis not present

## 2018-07-24 DIAGNOSIS — N184 Chronic kidney disease, stage 4 (severe): Secondary | ICD-10-CM | POA: Diagnosis not present

## 2018-07-24 DIAGNOSIS — I679 Cerebrovascular disease, unspecified: Secondary | ICD-10-CM | POA: Diagnosis not present

## 2018-07-24 DIAGNOSIS — F0151 Vascular dementia with behavioral disturbance: Secondary | ICD-10-CM | POA: Diagnosis not present

## 2018-07-28 DIAGNOSIS — I672 Cerebral atherosclerosis: Secondary | ICD-10-CM | POA: Diagnosis not present

## 2018-07-28 DIAGNOSIS — I679 Cerebrovascular disease, unspecified: Secondary | ICD-10-CM | POA: Diagnosis not present

## 2018-07-28 DIAGNOSIS — F0151 Vascular dementia with behavioral disturbance: Secondary | ICD-10-CM | POA: Diagnosis not present

## 2018-07-28 DIAGNOSIS — I25810 Atherosclerosis of coronary artery bypass graft(s) without angina pectoris: Secondary | ICD-10-CM | POA: Diagnosis not present

## 2018-07-28 DIAGNOSIS — N184 Chronic kidney disease, stage 4 (severe): Secondary | ICD-10-CM | POA: Diagnosis not present

## 2018-07-28 DIAGNOSIS — E1159 Type 2 diabetes mellitus with other circulatory complications: Secondary | ICD-10-CM | POA: Diagnosis not present

## 2018-07-30 DIAGNOSIS — N184 Chronic kidney disease, stage 4 (severe): Secondary | ICD-10-CM | POA: Diagnosis not present

## 2018-07-30 DIAGNOSIS — I672 Cerebral atherosclerosis: Secondary | ICD-10-CM | POA: Diagnosis not present

## 2018-07-30 DIAGNOSIS — I25810 Atherosclerosis of coronary artery bypass graft(s) without angina pectoris: Secondary | ICD-10-CM | POA: Diagnosis not present

## 2018-07-30 DIAGNOSIS — E1159 Type 2 diabetes mellitus with other circulatory complications: Secondary | ICD-10-CM | POA: Diagnosis not present

## 2018-07-30 DIAGNOSIS — F0151 Vascular dementia with behavioral disturbance: Secondary | ICD-10-CM | POA: Diagnosis not present

## 2018-07-30 DIAGNOSIS — I679 Cerebrovascular disease, unspecified: Secondary | ICD-10-CM | POA: Diagnosis not present

## 2018-08-04 DIAGNOSIS — F0151 Vascular dementia with behavioral disturbance: Secondary | ICD-10-CM | POA: Diagnosis not present

## 2018-08-04 DIAGNOSIS — E1159 Type 2 diabetes mellitus with other circulatory complications: Secondary | ICD-10-CM | POA: Diagnosis not present

## 2018-08-04 DIAGNOSIS — I679 Cerebrovascular disease, unspecified: Secondary | ICD-10-CM | POA: Diagnosis not present

## 2018-08-04 DIAGNOSIS — N184 Chronic kidney disease, stage 4 (severe): Secondary | ICD-10-CM | POA: Diagnosis not present

## 2018-08-04 DIAGNOSIS — I672 Cerebral atherosclerosis: Secondary | ICD-10-CM | POA: Diagnosis not present

## 2018-08-04 DIAGNOSIS — I25810 Atherosclerosis of coronary artery bypass graft(s) without angina pectoris: Secondary | ICD-10-CM | POA: Diagnosis not present

## 2018-08-06 DIAGNOSIS — E1159 Type 2 diabetes mellitus with other circulatory complications: Secondary | ICD-10-CM | POA: Diagnosis not present

## 2018-08-06 DIAGNOSIS — I672 Cerebral atherosclerosis: Secondary | ICD-10-CM | POA: Diagnosis not present

## 2018-08-06 DIAGNOSIS — F0151 Vascular dementia with behavioral disturbance: Secondary | ICD-10-CM | POA: Diagnosis not present

## 2018-08-06 DIAGNOSIS — N184 Chronic kidney disease, stage 4 (severe): Secondary | ICD-10-CM | POA: Diagnosis not present

## 2018-08-06 DIAGNOSIS — I679 Cerebrovascular disease, unspecified: Secondary | ICD-10-CM | POA: Diagnosis not present

## 2018-08-06 DIAGNOSIS — I25810 Atherosclerosis of coronary artery bypass graft(s) without angina pectoris: Secondary | ICD-10-CM | POA: Diagnosis not present

## 2018-08-11 DIAGNOSIS — F0151 Vascular dementia with behavioral disturbance: Secondary | ICD-10-CM | POA: Diagnosis not present

## 2018-08-11 DIAGNOSIS — I672 Cerebral atherosclerosis: Secondary | ICD-10-CM | POA: Diagnosis not present

## 2018-08-11 DIAGNOSIS — N184 Chronic kidney disease, stage 4 (severe): Secondary | ICD-10-CM | POA: Diagnosis not present

## 2018-08-11 DIAGNOSIS — E1159 Type 2 diabetes mellitus with other circulatory complications: Secondary | ICD-10-CM | POA: Diagnosis not present

## 2018-08-11 DIAGNOSIS — I679 Cerebrovascular disease, unspecified: Secondary | ICD-10-CM | POA: Diagnosis not present

## 2018-08-11 DIAGNOSIS — I25810 Atherosclerosis of coronary artery bypass graft(s) without angina pectoris: Secondary | ICD-10-CM | POA: Diagnosis not present

## 2018-08-12 DIAGNOSIS — I672 Cerebral atherosclerosis: Secondary | ICD-10-CM | POA: Diagnosis not present

## 2018-08-12 DIAGNOSIS — F0151 Vascular dementia with behavioral disturbance: Secondary | ICD-10-CM | POA: Diagnosis not present

## 2018-08-12 DIAGNOSIS — N184 Chronic kidney disease, stage 4 (severe): Secondary | ICD-10-CM | POA: Diagnosis not present

## 2018-08-12 DIAGNOSIS — I679 Cerebrovascular disease, unspecified: Secondary | ICD-10-CM | POA: Diagnosis not present

## 2018-08-12 DIAGNOSIS — I25810 Atherosclerosis of coronary artery bypass graft(s) without angina pectoris: Secondary | ICD-10-CM | POA: Diagnosis not present

## 2018-08-12 DIAGNOSIS — E1159 Type 2 diabetes mellitus with other circulatory complications: Secondary | ICD-10-CM | POA: Diagnosis not present

## 2018-08-13 DIAGNOSIS — I25810 Atherosclerosis of coronary artery bypass graft(s) without angina pectoris: Secondary | ICD-10-CM | POA: Diagnosis not present

## 2018-08-13 DIAGNOSIS — N184 Chronic kidney disease, stage 4 (severe): Secondary | ICD-10-CM | POA: Diagnosis not present

## 2018-08-13 DIAGNOSIS — E1159 Type 2 diabetes mellitus with other circulatory complications: Secondary | ICD-10-CM | POA: Diagnosis not present

## 2018-08-13 DIAGNOSIS — I679 Cerebrovascular disease, unspecified: Secondary | ICD-10-CM | POA: Diagnosis not present

## 2018-08-13 DIAGNOSIS — F0151 Vascular dementia with behavioral disturbance: Secondary | ICD-10-CM | POA: Diagnosis not present

## 2018-08-13 DIAGNOSIS — I672 Cerebral atherosclerosis: Secondary | ICD-10-CM | POA: Diagnosis not present

## 2018-08-14 DIAGNOSIS — I25810 Atherosclerosis of coronary artery bypass graft(s) without angina pectoris: Secondary | ICD-10-CM | POA: Diagnosis not present

## 2018-08-14 DIAGNOSIS — M109 Gout, unspecified: Secondary | ICD-10-CM | POA: Diagnosis not present

## 2018-08-14 DIAGNOSIS — R4701 Aphasia: Secondary | ICD-10-CM | POA: Diagnosis not present

## 2018-08-14 DIAGNOSIS — I679 Cerebrovascular disease, unspecified: Secondary | ICD-10-CM | POA: Diagnosis not present

## 2018-08-14 DIAGNOSIS — F0151 Vascular dementia with behavioral disturbance: Secondary | ICD-10-CM | POA: Diagnosis not present

## 2018-08-14 DIAGNOSIS — R1310 Dysphagia, unspecified: Secondary | ICD-10-CM | POA: Diagnosis not present

## 2018-08-14 DIAGNOSIS — Z8546 Personal history of malignant neoplasm of prostate: Secondary | ICD-10-CM | POA: Diagnosis not present

## 2018-08-14 DIAGNOSIS — E1159 Type 2 diabetes mellitus with other circulatory complications: Secondary | ICD-10-CM | POA: Diagnosis not present

## 2018-08-14 DIAGNOSIS — N184 Chronic kidney disease, stage 4 (severe): Secondary | ICD-10-CM | POA: Diagnosis not present

## 2018-08-14 DIAGNOSIS — R54 Age-related physical debility: Secondary | ICD-10-CM | POA: Diagnosis not present

## 2018-08-14 DIAGNOSIS — E785 Hyperlipidemia, unspecified: Secondary | ICD-10-CM | POA: Diagnosis not present

## 2018-08-14 DIAGNOSIS — I672 Cerebral atherosclerosis: Secondary | ICD-10-CM | POA: Diagnosis not present

## 2018-08-14 DIAGNOSIS — J309 Allergic rhinitis, unspecified: Secondary | ICD-10-CM | POA: Diagnosis not present

## 2018-08-14 DIAGNOSIS — E039 Hypothyroidism, unspecified: Secondary | ICD-10-CM | POA: Diagnosis not present

## 2018-08-14 DIAGNOSIS — I10 Essential (primary) hypertension: Secondary | ICD-10-CM | POA: Diagnosis not present

## 2018-08-18 DIAGNOSIS — I679 Cerebrovascular disease, unspecified: Secondary | ICD-10-CM | POA: Diagnosis not present

## 2018-08-18 DIAGNOSIS — I672 Cerebral atherosclerosis: Secondary | ICD-10-CM | POA: Diagnosis not present

## 2018-08-18 DIAGNOSIS — F0151 Vascular dementia with behavioral disturbance: Secondary | ICD-10-CM | POA: Diagnosis not present

## 2018-08-18 DIAGNOSIS — N184 Chronic kidney disease, stage 4 (severe): Secondary | ICD-10-CM | POA: Diagnosis not present

## 2018-08-18 DIAGNOSIS — I25810 Atherosclerosis of coronary artery bypass graft(s) without angina pectoris: Secondary | ICD-10-CM | POA: Diagnosis not present

## 2018-08-18 DIAGNOSIS — E1159 Type 2 diabetes mellitus with other circulatory complications: Secondary | ICD-10-CM | POA: Diagnosis not present

## 2018-08-20 DIAGNOSIS — I679 Cerebrovascular disease, unspecified: Secondary | ICD-10-CM | POA: Diagnosis not present

## 2018-08-20 DIAGNOSIS — I672 Cerebral atherosclerosis: Secondary | ICD-10-CM | POA: Diagnosis not present

## 2018-08-20 DIAGNOSIS — E1159 Type 2 diabetes mellitus with other circulatory complications: Secondary | ICD-10-CM | POA: Diagnosis not present

## 2018-08-20 DIAGNOSIS — F0151 Vascular dementia with behavioral disturbance: Secondary | ICD-10-CM | POA: Diagnosis not present

## 2018-08-20 DIAGNOSIS — I25810 Atherosclerosis of coronary artery bypass graft(s) without angina pectoris: Secondary | ICD-10-CM | POA: Diagnosis not present

## 2018-08-20 DIAGNOSIS — N184 Chronic kidney disease, stage 4 (severe): Secondary | ICD-10-CM | POA: Diagnosis not present

## 2018-08-21 DIAGNOSIS — I679 Cerebrovascular disease, unspecified: Secondary | ICD-10-CM | POA: Diagnosis not present

## 2018-08-21 DIAGNOSIS — I25810 Atherosclerosis of coronary artery bypass graft(s) without angina pectoris: Secondary | ICD-10-CM | POA: Diagnosis not present

## 2018-08-21 DIAGNOSIS — F0151 Vascular dementia with behavioral disturbance: Secondary | ICD-10-CM | POA: Diagnosis not present

## 2018-08-21 DIAGNOSIS — E1159 Type 2 diabetes mellitus with other circulatory complications: Secondary | ICD-10-CM | POA: Diagnosis not present

## 2018-08-21 DIAGNOSIS — I672 Cerebral atherosclerosis: Secondary | ICD-10-CM | POA: Diagnosis not present

## 2018-08-21 DIAGNOSIS — N184 Chronic kidney disease, stage 4 (severe): Secondary | ICD-10-CM | POA: Diagnosis not present

## 2018-08-24 DIAGNOSIS — I672 Cerebral atherosclerosis: Secondary | ICD-10-CM | POA: Diagnosis not present

## 2018-08-24 DIAGNOSIS — I25810 Atherosclerosis of coronary artery bypass graft(s) without angina pectoris: Secondary | ICD-10-CM | POA: Diagnosis not present

## 2018-08-24 DIAGNOSIS — I679 Cerebrovascular disease, unspecified: Secondary | ICD-10-CM | POA: Diagnosis not present

## 2018-08-24 DIAGNOSIS — F0151 Vascular dementia with behavioral disturbance: Secondary | ICD-10-CM | POA: Diagnosis not present

## 2018-08-24 DIAGNOSIS — E1159 Type 2 diabetes mellitus with other circulatory complications: Secondary | ICD-10-CM | POA: Diagnosis not present

## 2018-08-24 DIAGNOSIS — N184 Chronic kidney disease, stage 4 (severe): Secondary | ICD-10-CM | POA: Diagnosis not present

## 2018-08-25 DIAGNOSIS — I679 Cerebrovascular disease, unspecified: Secondary | ICD-10-CM | POA: Diagnosis not present

## 2018-08-25 DIAGNOSIS — I25810 Atherosclerosis of coronary artery bypass graft(s) without angina pectoris: Secondary | ICD-10-CM | POA: Diagnosis not present

## 2018-08-25 DIAGNOSIS — I672 Cerebral atherosclerosis: Secondary | ICD-10-CM | POA: Diagnosis not present

## 2018-08-25 DIAGNOSIS — F0151 Vascular dementia with behavioral disturbance: Secondary | ICD-10-CM | POA: Diagnosis not present

## 2018-08-25 DIAGNOSIS — N184 Chronic kidney disease, stage 4 (severe): Secondary | ICD-10-CM | POA: Diagnosis not present

## 2018-08-25 DIAGNOSIS — E1159 Type 2 diabetes mellitus with other circulatory complications: Secondary | ICD-10-CM | POA: Diagnosis not present

## 2018-08-27 DIAGNOSIS — I679 Cerebrovascular disease, unspecified: Secondary | ICD-10-CM | POA: Diagnosis not present

## 2018-08-27 DIAGNOSIS — E1159 Type 2 diabetes mellitus with other circulatory complications: Secondary | ICD-10-CM | POA: Diagnosis not present

## 2018-08-27 DIAGNOSIS — F0151 Vascular dementia with behavioral disturbance: Secondary | ICD-10-CM | POA: Diagnosis not present

## 2018-08-27 DIAGNOSIS — I25810 Atherosclerosis of coronary artery bypass graft(s) without angina pectoris: Secondary | ICD-10-CM | POA: Diagnosis not present

## 2018-08-27 DIAGNOSIS — N184 Chronic kidney disease, stage 4 (severe): Secondary | ICD-10-CM | POA: Diagnosis not present

## 2018-08-27 DIAGNOSIS — I672 Cerebral atherosclerosis: Secondary | ICD-10-CM | POA: Diagnosis not present

## 2018-09-01 DIAGNOSIS — I679 Cerebrovascular disease, unspecified: Secondary | ICD-10-CM | POA: Diagnosis not present

## 2018-09-01 DIAGNOSIS — N184 Chronic kidney disease, stage 4 (severe): Secondary | ICD-10-CM | POA: Diagnosis not present

## 2018-09-01 DIAGNOSIS — I25810 Atherosclerosis of coronary artery bypass graft(s) without angina pectoris: Secondary | ICD-10-CM | POA: Diagnosis not present

## 2018-09-01 DIAGNOSIS — E1159 Type 2 diabetes mellitus with other circulatory complications: Secondary | ICD-10-CM | POA: Diagnosis not present

## 2018-09-01 DIAGNOSIS — I672 Cerebral atherosclerosis: Secondary | ICD-10-CM | POA: Diagnosis not present

## 2018-09-01 DIAGNOSIS — F0151 Vascular dementia with behavioral disturbance: Secondary | ICD-10-CM | POA: Diagnosis not present

## 2018-09-03 DIAGNOSIS — I25810 Atherosclerosis of coronary artery bypass graft(s) without angina pectoris: Secondary | ICD-10-CM | POA: Diagnosis not present

## 2018-09-03 DIAGNOSIS — I679 Cerebrovascular disease, unspecified: Secondary | ICD-10-CM | POA: Diagnosis not present

## 2018-09-03 DIAGNOSIS — N184 Chronic kidney disease, stage 4 (severe): Secondary | ICD-10-CM | POA: Diagnosis not present

## 2018-09-03 DIAGNOSIS — E1159 Type 2 diabetes mellitus with other circulatory complications: Secondary | ICD-10-CM | POA: Diagnosis not present

## 2018-09-03 DIAGNOSIS — I672 Cerebral atherosclerosis: Secondary | ICD-10-CM | POA: Diagnosis not present

## 2018-09-03 DIAGNOSIS — F0151 Vascular dementia with behavioral disturbance: Secondary | ICD-10-CM | POA: Diagnosis not present

## 2018-09-04 DIAGNOSIS — N184 Chronic kidney disease, stage 4 (severe): Secondary | ICD-10-CM | POA: Diagnosis not present

## 2018-09-04 DIAGNOSIS — F0151 Vascular dementia with behavioral disturbance: Secondary | ICD-10-CM | POA: Diagnosis not present

## 2018-09-04 DIAGNOSIS — E1159 Type 2 diabetes mellitus with other circulatory complications: Secondary | ICD-10-CM | POA: Diagnosis not present

## 2018-09-04 DIAGNOSIS — I679 Cerebrovascular disease, unspecified: Secondary | ICD-10-CM | POA: Diagnosis not present

## 2018-09-04 DIAGNOSIS — I672 Cerebral atherosclerosis: Secondary | ICD-10-CM | POA: Diagnosis not present

## 2018-09-04 DIAGNOSIS — I25810 Atherosclerosis of coronary artery bypass graft(s) without angina pectoris: Secondary | ICD-10-CM | POA: Diagnosis not present

## 2018-09-08 DIAGNOSIS — I679 Cerebrovascular disease, unspecified: Secondary | ICD-10-CM | POA: Diagnosis not present

## 2018-09-08 DIAGNOSIS — I672 Cerebral atherosclerosis: Secondary | ICD-10-CM | POA: Diagnosis not present

## 2018-09-08 DIAGNOSIS — N184 Chronic kidney disease, stage 4 (severe): Secondary | ICD-10-CM | POA: Diagnosis not present

## 2018-09-08 DIAGNOSIS — E1159 Type 2 diabetes mellitus with other circulatory complications: Secondary | ICD-10-CM | POA: Diagnosis not present

## 2018-09-08 DIAGNOSIS — I25810 Atherosclerosis of coronary artery bypass graft(s) without angina pectoris: Secondary | ICD-10-CM | POA: Diagnosis not present

## 2018-09-08 DIAGNOSIS — F0151 Vascular dementia with behavioral disturbance: Secondary | ICD-10-CM | POA: Diagnosis not present

## 2018-09-13 DIAGNOSIS — M109 Gout, unspecified: Secondary | ICD-10-CM | POA: Diagnosis not present

## 2018-09-13 DIAGNOSIS — N184 Chronic kidney disease, stage 4 (severe): Secondary | ICD-10-CM | POA: Diagnosis not present

## 2018-09-13 DIAGNOSIS — F0151 Vascular dementia with behavioral disturbance: Secondary | ICD-10-CM | POA: Diagnosis not present

## 2018-09-13 DIAGNOSIS — R54 Age-related physical debility: Secondary | ICD-10-CM | POA: Diagnosis not present

## 2018-09-13 DIAGNOSIS — E1159 Type 2 diabetes mellitus with other circulatory complications: Secondary | ICD-10-CM | POA: Diagnosis not present

## 2018-09-13 DIAGNOSIS — I679 Cerebrovascular disease, unspecified: Secondary | ICD-10-CM | POA: Diagnosis not present

## 2018-09-13 DIAGNOSIS — R4701 Aphasia: Secondary | ICD-10-CM | POA: Diagnosis not present

## 2018-09-13 DIAGNOSIS — Z8546 Personal history of malignant neoplasm of prostate: Secondary | ICD-10-CM | POA: Diagnosis not present

## 2018-09-13 DIAGNOSIS — I25810 Atherosclerosis of coronary artery bypass graft(s) without angina pectoris: Secondary | ICD-10-CM | POA: Diagnosis not present

## 2018-09-13 DIAGNOSIS — J309 Allergic rhinitis, unspecified: Secondary | ICD-10-CM | POA: Diagnosis not present

## 2018-09-13 DIAGNOSIS — I10 Essential (primary) hypertension: Secondary | ICD-10-CM | POA: Diagnosis not present

## 2018-09-13 DIAGNOSIS — I672 Cerebral atherosclerosis: Secondary | ICD-10-CM | POA: Diagnosis not present

## 2018-09-13 DIAGNOSIS — R1310 Dysphagia, unspecified: Secondary | ICD-10-CM | POA: Diagnosis not present

## 2018-09-13 DIAGNOSIS — E785 Hyperlipidemia, unspecified: Secondary | ICD-10-CM | POA: Diagnosis not present

## 2018-09-13 DIAGNOSIS — E039 Hypothyroidism, unspecified: Secondary | ICD-10-CM | POA: Diagnosis not present

## 2018-09-14 DIAGNOSIS — I679 Cerebrovascular disease, unspecified: Secondary | ICD-10-CM | POA: Diagnosis not present

## 2018-09-14 DIAGNOSIS — N184 Chronic kidney disease, stage 4 (severe): Secondary | ICD-10-CM | POA: Diagnosis not present

## 2018-09-14 DIAGNOSIS — I672 Cerebral atherosclerosis: Secondary | ICD-10-CM | POA: Diagnosis not present

## 2018-09-14 DIAGNOSIS — F0151 Vascular dementia with behavioral disturbance: Secondary | ICD-10-CM | POA: Diagnosis not present

## 2018-09-14 DIAGNOSIS — E1159 Type 2 diabetes mellitus with other circulatory complications: Secondary | ICD-10-CM | POA: Diagnosis not present

## 2018-09-14 DIAGNOSIS — I25810 Atherosclerosis of coronary artery bypass graft(s) without angina pectoris: Secondary | ICD-10-CM | POA: Diagnosis not present

## 2018-09-15 DIAGNOSIS — I672 Cerebral atherosclerosis: Secondary | ICD-10-CM | POA: Diagnosis not present

## 2018-09-15 DIAGNOSIS — N184 Chronic kidney disease, stage 4 (severe): Secondary | ICD-10-CM | POA: Diagnosis not present

## 2018-09-15 DIAGNOSIS — I25810 Atherosclerosis of coronary artery bypass graft(s) without angina pectoris: Secondary | ICD-10-CM | POA: Diagnosis not present

## 2018-09-15 DIAGNOSIS — I679 Cerebrovascular disease, unspecified: Secondary | ICD-10-CM | POA: Diagnosis not present

## 2018-09-15 DIAGNOSIS — E1159 Type 2 diabetes mellitus with other circulatory complications: Secondary | ICD-10-CM | POA: Diagnosis not present

## 2018-09-15 DIAGNOSIS — F0151 Vascular dementia with behavioral disturbance: Secondary | ICD-10-CM | POA: Diagnosis not present

## 2018-09-17 DIAGNOSIS — F0151 Vascular dementia with behavioral disturbance: Secondary | ICD-10-CM | POA: Diagnosis not present

## 2018-09-17 DIAGNOSIS — I679 Cerebrovascular disease, unspecified: Secondary | ICD-10-CM | POA: Diagnosis not present

## 2018-09-17 DIAGNOSIS — N184 Chronic kidney disease, stage 4 (severe): Secondary | ICD-10-CM | POA: Diagnosis not present

## 2018-09-17 DIAGNOSIS — I672 Cerebral atherosclerosis: Secondary | ICD-10-CM | POA: Diagnosis not present

## 2018-09-17 DIAGNOSIS — I25810 Atherosclerosis of coronary artery bypass graft(s) without angina pectoris: Secondary | ICD-10-CM | POA: Diagnosis not present

## 2018-09-17 DIAGNOSIS — E1159 Type 2 diabetes mellitus with other circulatory complications: Secondary | ICD-10-CM | POA: Diagnosis not present

## 2018-09-18 DIAGNOSIS — I25810 Atherosclerosis of coronary artery bypass graft(s) without angina pectoris: Secondary | ICD-10-CM | POA: Diagnosis not present

## 2018-09-18 DIAGNOSIS — E1159 Type 2 diabetes mellitus with other circulatory complications: Secondary | ICD-10-CM | POA: Diagnosis not present

## 2018-09-18 DIAGNOSIS — N184 Chronic kidney disease, stage 4 (severe): Secondary | ICD-10-CM | POA: Diagnosis not present

## 2018-09-18 DIAGNOSIS — I672 Cerebral atherosclerosis: Secondary | ICD-10-CM | POA: Diagnosis not present

## 2018-09-18 DIAGNOSIS — I679 Cerebrovascular disease, unspecified: Secondary | ICD-10-CM | POA: Diagnosis not present

## 2018-09-18 DIAGNOSIS — F0151 Vascular dementia with behavioral disturbance: Secondary | ICD-10-CM | POA: Diagnosis not present

## 2018-09-22 DIAGNOSIS — F0151 Vascular dementia with behavioral disturbance: Secondary | ICD-10-CM | POA: Diagnosis not present

## 2018-09-22 DIAGNOSIS — I679 Cerebrovascular disease, unspecified: Secondary | ICD-10-CM | POA: Diagnosis not present

## 2018-09-22 DIAGNOSIS — I25810 Atherosclerosis of coronary artery bypass graft(s) without angina pectoris: Secondary | ICD-10-CM | POA: Diagnosis not present

## 2018-09-22 DIAGNOSIS — I672 Cerebral atherosclerosis: Secondary | ICD-10-CM | POA: Diagnosis not present

## 2018-09-22 DIAGNOSIS — E1159 Type 2 diabetes mellitus with other circulatory complications: Secondary | ICD-10-CM | POA: Diagnosis not present

## 2018-09-22 DIAGNOSIS — N184 Chronic kidney disease, stage 4 (severe): Secondary | ICD-10-CM | POA: Diagnosis not present

## 2018-09-24 DIAGNOSIS — I679 Cerebrovascular disease, unspecified: Secondary | ICD-10-CM | POA: Diagnosis not present

## 2018-09-24 DIAGNOSIS — I672 Cerebral atherosclerosis: Secondary | ICD-10-CM | POA: Diagnosis not present

## 2018-09-24 DIAGNOSIS — N184 Chronic kidney disease, stage 4 (severe): Secondary | ICD-10-CM | POA: Diagnosis not present

## 2018-09-24 DIAGNOSIS — E1159 Type 2 diabetes mellitus with other circulatory complications: Secondary | ICD-10-CM | POA: Diagnosis not present

## 2018-09-24 DIAGNOSIS — I25810 Atherosclerosis of coronary artery bypass graft(s) without angina pectoris: Secondary | ICD-10-CM | POA: Diagnosis not present

## 2018-09-24 DIAGNOSIS — F0151 Vascular dementia with behavioral disturbance: Secondary | ICD-10-CM | POA: Diagnosis not present

## 2018-09-29 DIAGNOSIS — N184 Chronic kidney disease, stage 4 (severe): Secondary | ICD-10-CM | POA: Diagnosis not present

## 2018-09-29 DIAGNOSIS — E1159 Type 2 diabetes mellitus with other circulatory complications: Secondary | ICD-10-CM | POA: Diagnosis not present

## 2018-09-29 DIAGNOSIS — I679 Cerebrovascular disease, unspecified: Secondary | ICD-10-CM | POA: Diagnosis not present

## 2018-09-29 DIAGNOSIS — I672 Cerebral atherosclerosis: Secondary | ICD-10-CM | POA: Diagnosis not present

## 2018-09-29 DIAGNOSIS — F0151 Vascular dementia with behavioral disturbance: Secondary | ICD-10-CM | POA: Diagnosis not present

## 2018-09-29 DIAGNOSIS — I25810 Atherosclerosis of coronary artery bypass graft(s) without angina pectoris: Secondary | ICD-10-CM | POA: Diagnosis not present

## 2018-10-01 DIAGNOSIS — I25810 Atherosclerosis of coronary artery bypass graft(s) without angina pectoris: Secondary | ICD-10-CM | POA: Diagnosis not present

## 2018-10-01 DIAGNOSIS — E1159 Type 2 diabetes mellitus with other circulatory complications: Secondary | ICD-10-CM | POA: Diagnosis not present

## 2018-10-01 DIAGNOSIS — F0151 Vascular dementia with behavioral disturbance: Secondary | ICD-10-CM | POA: Diagnosis not present

## 2018-10-01 DIAGNOSIS — N184 Chronic kidney disease, stage 4 (severe): Secondary | ICD-10-CM | POA: Diagnosis not present

## 2018-10-01 DIAGNOSIS — I672 Cerebral atherosclerosis: Secondary | ICD-10-CM | POA: Diagnosis not present

## 2018-10-01 DIAGNOSIS — I679 Cerebrovascular disease, unspecified: Secondary | ICD-10-CM | POA: Diagnosis not present

## 2018-10-02 DIAGNOSIS — E1159 Type 2 diabetes mellitus with other circulatory complications: Secondary | ICD-10-CM | POA: Diagnosis not present

## 2018-10-02 DIAGNOSIS — I679 Cerebrovascular disease, unspecified: Secondary | ICD-10-CM | POA: Diagnosis not present

## 2018-10-02 DIAGNOSIS — N184 Chronic kidney disease, stage 4 (severe): Secondary | ICD-10-CM | POA: Diagnosis not present

## 2018-10-02 DIAGNOSIS — I25810 Atherosclerosis of coronary artery bypass graft(s) without angina pectoris: Secondary | ICD-10-CM | POA: Diagnosis not present

## 2018-10-02 DIAGNOSIS — F0151 Vascular dementia with behavioral disturbance: Secondary | ICD-10-CM | POA: Diagnosis not present

## 2018-10-02 DIAGNOSIS — I672 Cerebral atherosclerosis: Secondary | ICD-10-CM | POA: Diagnosis not present

## 2018-10-03 DIAGNOSIS — N184 Chronic kidney disease, stage 4 (severe): Secondary | ICD-10-CM | POA: Diagnosis not present

## 2018-10-03 DIAGNOSIS — I679 Cerebrovascular disease, unspecified: Secondary | ICD-10-CM | POA: Diagnosis not present

## 2018-10-03 DIAGNOSIS — F0151 Vascular dementia with behavioral disturbance: Secondary | ICD-10-CM | POA: Diagnosis not present

## 2018-10-03 DIAGNOSIS — I672 Cerebral atherosclerosis: Secondary | ICD-10-CM | POA: Diagnosis not present

## 2018-10-03 DIAGNOSIS — I25810 Atherosclerosis of coronary artery bypass graft(s) without angina pectoris: Secondary | ICD-10-CM | POA: Diagnosis not present

## 2018-10-03 DIAGNOSIS — E1159 Type 2 diabetes mellitus with other circulatory complications: Secondary | ICD-10-CM | POA: Diagnosis not present

## 2018-10-06 DIAGNOSIS — N184 Chronic kidney disease, stage 4 (severe): Secondary | ICD-10-CM | POA: Diagnosis not present

## 2018-10-06 DIAGNOSIS — E1159 Type 2 diabetes mellitus with other circulatory complications: Secondary | ICD-10-CM | POA: Diagnosis not present

## 2018-10-06 DIAGNOSIS — F0151 Vascular dementia with behavioral disturbance: Secondary | ICD-10-CM | POA: Diagnosis not present

## 2018-10-06 DIAGNOSIS — I672 Cerebral atherosclerosis: Secondary | ICD-10-CM | POA: Diagnosis not present

## 2018-10-06 DIAGNOSIS — I679 Cerebrovascular disease, unspecified: Secondary | ICD-10-CM | POA: Diagnosis not present

## 2018-10-06 DIAGNOSIS — I25810 Atherosclerosis of coronary artery bypass graft(s) without angina pectoris: Secondary | ICD-10-CM | POA: Diagnosis not present

## 2018-10-08 DIAGNOSIS — F0151 Vascular dementia with behavioral disturbance: Secondary | ICD-10-CM | POA: Diagnosis not present

## 2018-10-08 DIAGNOSIS — I679 Cerebrovascular disease, unspecified: Secondary | ICD-10-CM | POA: Diagnosis not present

## 2018-10-08 DIAGNOSIS — I25810 Atherosclerosis of coronary artery bypass graft(s) without angina pectoris: Secondary | ICD-10-CM | POA: Diagnosis not present

## 2018-10-08 DIAGNOSIS — E1159 Type 2 diabetes mellitus with other circulatory complications: Secondary | ICD-10-CM | POA: Diagnosis not present

## 2018-10-08 DIAGNOSIS — N184 Chronic kidney disease, stage 4 (severe): Secondary | ICD-10-CM | POA: Diagnosis not present

## 2018-10-08 DIAGNOSIS — I672 Cerebral atherosclerosis: Secondary | ICD-10-CM | POA: Diagnosis not present

## 2018-10-10 DIAGNOSIS — I25810 Atherosclerosis of coronary artery bypass graft(s) without angina pectoris: Secondary | ICD-10-CM | POA: Diagnosis not present

## 2018-10-10 DIAGNOSIS — F0151 Vascular dementia with behavioral disturbance: Secondary | ICD-10-CM | POA: Diagnosis not present

## 2018-10-10 DIAGNOSIS — N184 Chronic kidney disease, stage 4 (severe): Secondary | ICD-10-CM | POA: Diagnosis not present

## 2018-10-10 DIAGNOSIS — I672 Cerebral atherosclerosis: Secondary | ICD-10-CM | POA: Diagnosis not present

## 2018-10-10 DIAGNOSIS — E1159 Type 2 diabetes mellitus with other circulatory complications: Secondary | ICD-10-CM | POA: Diagnosis not present

## 2018-10-10 DIAGNOSIS — I679 Cerebrovascular disease, unspecified: Secondary | ICD-10-CM | POA: Diagnosis not present

## 2018-10-12 DIAGNOSIS — I672 Cerebral atherosclerosis: Secondary | ICD-10-CM | POA: Diagnosis not present

## 2018-10-12 DIAGNOSIS — F0151 Vascular dementia with behavioral disturbance: Secondary | ICD-10-CM | POA: Diagnosis not present

## 2018-10-12 DIAGNOSIS — E1159 Type 2 diabetes mellitus with other circulatory complications: Secondary | ICD-10-CM | POA: Diagnosis not present

## 2018-10-12 DIAGNOSIS — I25810 Atherosclerosis of coronary artery bypass graft(s) without angina pectoris: Secondary | ICD-10-CM | POA: Diagnosis not present

## 2018-10-12 DIAGNOSIS — N184 Chronic kidney disease, stage 4 (severe): Secondary | ICD-10-CM | POA: Diagnosis not present

## 2018-10-12 DIAGNOSIS — I679 Cerebrovascular disease, unspecified: Secondary | ICD-10-CM | POA: Diagnosis not present

## 2018-10-13 DIAGNOSIS — I672 Cerebral atherosclerosis: Secondary | ICD-10-CM | POA: Diagnosis not present

## 2018-10-13 DIAGNOSIS — I25810 Atherosclerosis of coronary artery bypass graft(s) without angina pectoris: Secondary | ICD-10-CM | POA: Diagnosis not present

## 2018-10-13 DIAGNOSIS — N184 Chronic kidney disease, stage 4 (severe): Secondary | ICD-10-CM | POA: Diagnosis not present

## 2018-10-13 DIAGNOSIS — E1159 Type 2 diabetes mellitus with other circulatory complications: Secondary | ICD-10-CM | POA: Diagnosis not present

## 2018-10-13 DIAGNOSIS — F0151 Vascular dementia with behavioral disturbance: Secondary | ICD-10-CM | POA: Diagnosis not present

## 2018-10-13 DIAGNOSIS — I679 Cerebrovascular disease, unspecified: Secondary | ICD-10-CM | POA: Diagnosis not present

## 2018-10-14 DIAGNOSIS — E785 Hyperlipidemia, unspecified: Secondary | ICD-10-CM | POA: Diagnosis not present

## 2018-10-14 DIAGNOSIS — I10 Essential (primary) hypertension: Secondary | ICD-10-CM | POA: Diagnosis not present

## 2018-10-14 DIAGNOSIS — Z8546 Personal history of malignant neoplasm of prostate: Secondary | ICD-10-CM | POA: Diagnosis not present

## 2018-10-14 DIAGNOSIS — I679 Cerebrovascular disease, unspecified: Secondary | ICD-10-CM | POA: Diagnosis not present

## 2018-10-14 DIAGNOSIS — R54 Age-related physical debility: Secondary | ICD-10-CM | POA: Diagnosis not present

## 2018-10-14 DIAGNOSIS — R4701 Aphasia: Secondary | ICD-10-CM | POA: Diagnosis not present

## 2018-10-14 DIAGNOSIS — J309 Allergic rhinitis, unspecified: Secondary | ICD-10-CM | POA: Diagnosis not present

## 2018-10-14 DIAGNOSIS — R1310 Dysphagia, unspecified: Secondary | ICD-10-CM | POA: Diagnosis not present

## 2018-10-14 DIAGNOSIS — N184 Chronic kidney disease, stage 4 (severe): Secondary | ICD-10-CM | POA: Diagnosis not present

## 2018-10-14 DIAGNOSIS — I25810 Atherosclerosis of coronary artery bypass graft(s) without angina pectoris: Secondary | ICD-10-CM | POA: Diagnosis not present

## 2018-10-14 DIAGNOSIS — M109 Gout, unspecified: Secondary | ICD-10-CM | POA: Diagnosis not present

## 2018-10-14 DIAGNOSIS — I672 Cerebral atherosclerosis: Secondary | ICD-10-CM | POA: Diagnosis not present

## 2018-10-14 DIAGNOSIS — E039 Hypothyroidism, unspecified: Secondary | ICD-10-CM | POA: Diagnosis not present

## 2018-10-14 DIAGNOSIS — E1159 Type 2 diabetes mellitus with other circulatory complications: Secondary | ICD-10-CM | POA: Diagnosis not present

## 2018-10-14 DIAGNOSIS — F0151 Vascular dementia with behavioral disturbance: Secondary | ICD-10-CM | POA: Diagnosis not present

## 2018-10-15 DIAGNOSIS — N184 Chronic kidney disease, stage 4 (severe): Secondary | ICD-10-CM | POA: Diagnosis not present

## 2018-10-15 DIAGNOSIS — F0151 Vascular dementia with behavioral disturbance: Secondary | ICD-10-CM | POA: Diagnosis not present

## 2018-10-15 DIAGNOSIS — I679 Cerebrovascular disease, unspecified: Secondary | ICD-10-CM | POA: Diagnosis not present

## 2018-10-15 DIAGNOSIS — I672 Cerebral atherosclerosis: Secondary | ICD-10-CM | POA: Diagnosis not present

## 2018-10-15 DIAGNOSIS — E1159 Type 2 diabetes mellitus with other circulatory complications: Secondary | ICD-10-CM | POA: Diagnosis not present

## 2018-10-15 DIAGNOSIS — I25810 Atherosclerosis of coronary artery bypass graft(s) without angina pectoris: Secondary | ICD-10-CM | POA: Diagnosis not present

## 2018-10-16 DIAGNOSIS — E1159 Type 2 diabetes mellitus with other circulatory complications: Secondary | ICD-10-CM | POA: Diagnosis not present

## 2018-10-16 DIAGNOSIS — I672 Cerebral atherosclerosis: Secondary | ICD-10-CM | POA: Diagnosis not present

## 2018-10-16 DIAGNOSIS — I679 Cerebrovascular disease, unspecified: Secondary | ICD-10-CM | POA: Diagnosis not present

## 2018-10-16 DIAGNOSIS — F0151 Vascular dementia with behavioral disturbance: Secondary | ICD-10-CM | POA: Diagnosis not present

## 2018-10-16 DIAGNOSIS — I25810 Atherosclerosis of coronary artery bypass graft(s) without angina pectoris: Secondary | ICD-10-CM | POA: Diagnosis not present

## 2018-10-16 DIAGNOSIS — N184 Chronic kidney disease, stage 4 (severe): Secondary | ICD-10-CM | POA: Diagnosis not present

## 2018-10-17 DIAGNOSIS — I672 Cerebral atherosclerosis: Secondary | ICD-10-CM | POA: Diagnosis not present

## 2018-10-17 DIAGNOSIS — I25810 Atherosclerosis of coronary artery bypass graft(s) without angina pectoris: Secondary | ICD-10-CM | POA: Diagnosis not present

## 2018-10-17 DIAGNOSIS — F0151 Vascular dementia with behavioral disturbance: Secondary | ICD-10-CM | POA: Diagnosis not present

## 2018-10-17 DIAGNOSIS — E1159 Type 2 diabetes mellitus with other circulatory complications: Secondary | ICD-10-CM | POA: Diagnosis not present

## 2018-10-17 DIAGNOSIS — I679 Cerebrovascular disease, unspecified: Secondary | ICD-10-CM | POA: Diagnosis not present

## 2018-10-17 DIAGNOSIS — N184 Chronic kidney disease, stage 4 (severe): Secondary | ICD-10-CM | POA: Diagnosis not present

## 2018-10-18 DIAGNOSIS — F0151 Vascular dementia with behavioral disturbance: Secondary | ICD-10-CM | POA: Diagnosis not present

## 2018-10-18 DIAGNOSIS — E1159 Type 2 diabetes mellitus with other circulatory complications: Secondary | ICD-10-CM | POA: Diagnosis not present

## 2018-10-18 DIAGNOSIS — N184 Chronic kidney disease, stage 4 (severe): Secondary | ICD-10-CM | POA: Diagnosis not present

## 2018-10-18 DIAGNOSIS — I25810 Atherosclerosis of coronary artery bypass graft(s) without angina pectoris: Secondary | ICD-10-CM | POA: Diagnosis not present

## 2018-10-18 DIAGNOSIS — I672 Cerebral atherosclerosis: Secondary | ICD-10-CM | POA: Diagnosis not present

## 2018-10-18 DIAGNOSIS — I679 Cerebrovascular disease, unspecified: Secondary | ICD-10-CM | POA: Diagnosis not present

## 2018-11-14 DEATH — deceased

## 2018-12-30 IMAGING — DX DG CHEST 1V PORT
1 series · 1 of 1 positions shown · non-contrast
Comparison: Chest radiograph May 21, 2017

CLINICAL DATA: Hypoglycemia.  History of diabetes and dementia.

EXAM:
PORTABLE CHEST 1 VIEW

[chest ap]
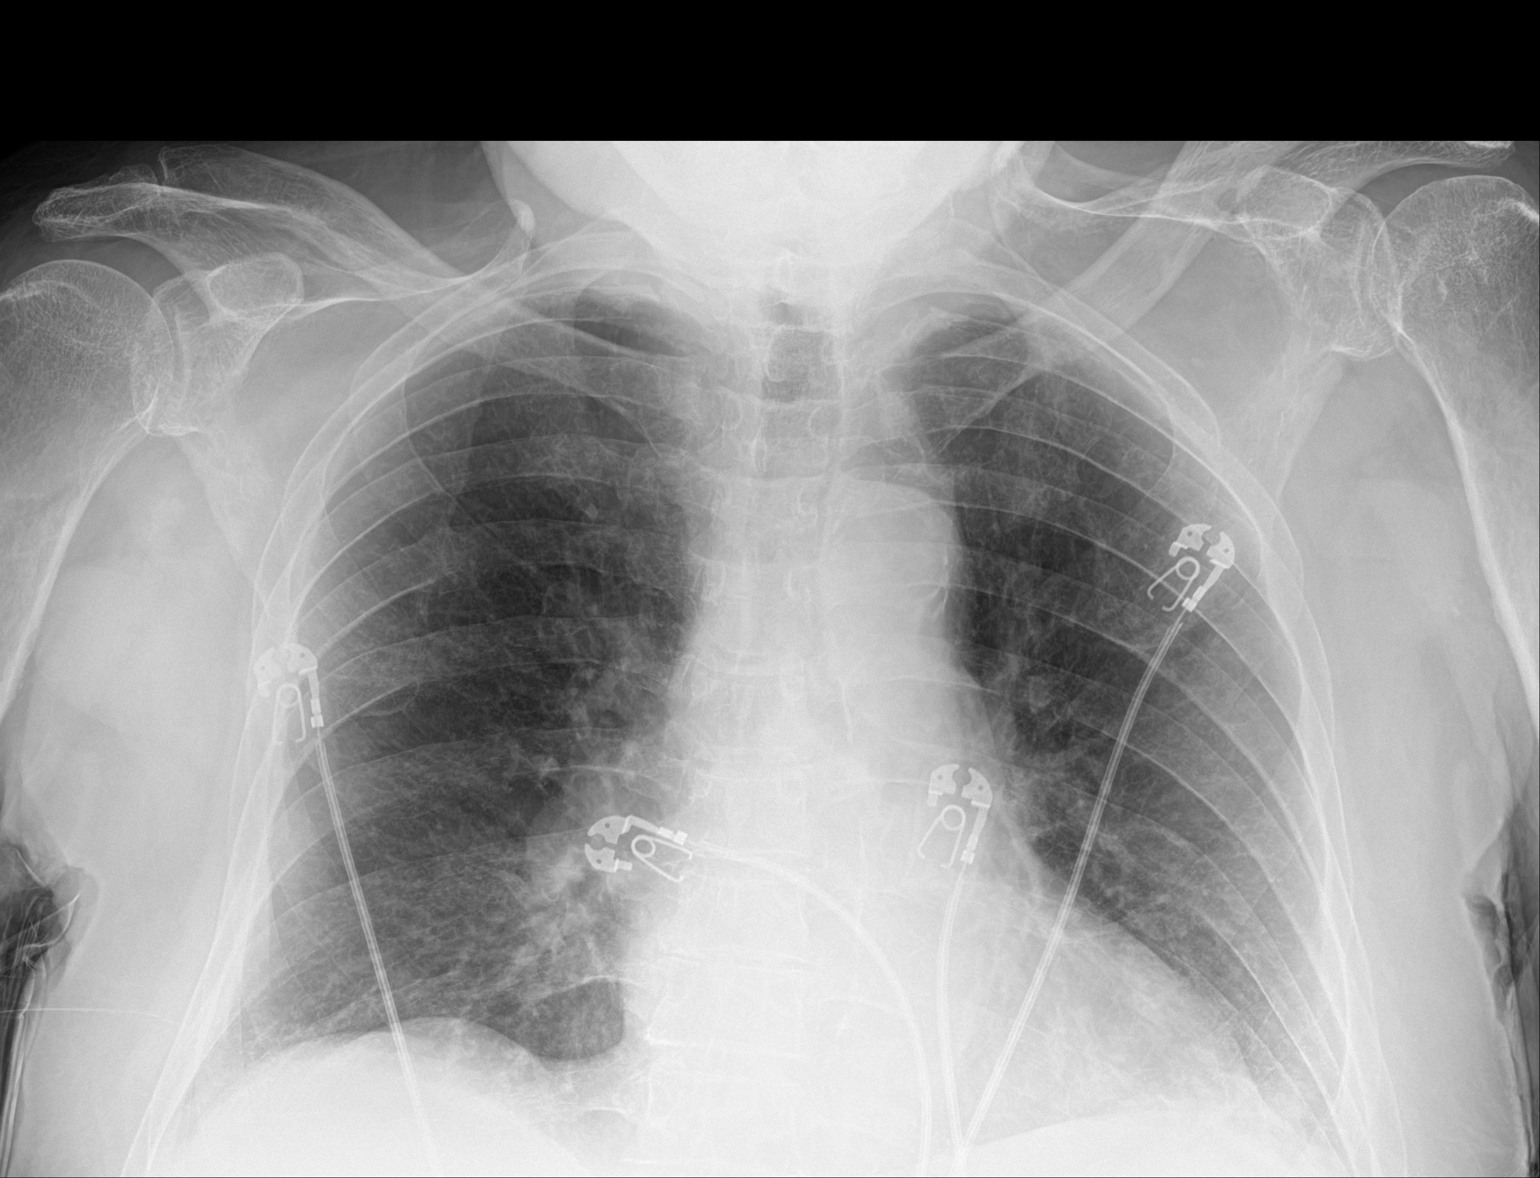

[1 of 1 positions shown; findings below may reference images not displayed]

FINDINGS: Cardiac silhouette is normal in size. Prominent hilar vascular
shadows unchanged. Calcified aortic knob. Mild chronic interstitial
changes with bibasilar strandy densities. No pleural effusion the
LEFT costophrenic angle is incompletely imaged. No pneumothorax.
Biapical pleural thickening. Osteopenia. Soft tissue planes are
nonsuspicious.
IMPRESSION: Mild chronic interstitial changes and bibasilar atelectasis.

Aortic Atherosclerosis ([id number]).
